# Patient Record
Sex: Female | Born: 1940 | Race: White | Hispanic: No | Marital: Married | Smoking: Never smoker
Health system: Southern US, Community
[De-identification: ages and names within clinical notes are randomized; demographics above are authoritative.]

## PROBLEM LIST (undated history)

## (undated) ENCOUNTER — Emergency Department

## (undated) DIAGNOSIS — C3490 Malignant neoplasm of unspecified part of unspecified bronchus or lung: Secondary | ICD-10-CM

## (undated) DIAGNOSIS — E785 Hyperlipidemia, unspecified: Secondary | ICD-10-CM

## (undated) DIAGNOSIS — E039 Hypothyroidism, unspecified: Secondary | ICD-10-CM

## (undated) DIAGNOSIS — R51 Headache: Secondary | ICD-10-CM

## (undated) DIAGNOSIS — I442 Atrioventricular block, complete: Secondary | ICD-10-CM

## (undated) DIAGNOSIS — Z950 Presence of cardiac pacemaker: Secondary | ICD-10-CM

## (undated) DIAGNOSIS — I729 Aneurysm of unspecified site: Secondary | ICD-10-CM

## (undated) DIAGNOSIS — R519 Headache, unspecified: Secondary | ICD-10-CM

## (undated) DIAGNOSIS — I10 Essential (primary) hypertension: Secondary | ICD-10-CM

## (undated) DIAGNOSIS — Z87448 Personal history of other diseases of urinary system: Secondary | ICD-10-CM

## (undated) DIAGNOSIS — R911 Solitary pulmonary nodule: Secondary | ICD-10-CM

## (undated) DIAGNOSIS — R011 Cardiac murmur, unspecified: Secondary | ICD-10-CM

## (undated) HISTORY — DX: Essential (primary) hypertension: I10

## (undated) HISTORY — DX: Atrioventricular block, complete: I44.2

## (undated) HISTORY — DX: Hyperlipidemia, unspecified: E78.5

## (undated) HISTORY — DX: Hypothyroidism, unspecified: E03.9

## (undated) HISTORY — PX: TUBAL LIGATION: SHX77

## (undated) HISTORY — PX: TONSILLECTOMY: SUR1361

---

## 2004-10-17 ENCOUNTER — Ambulatory Visit: Payer: Self-pay | Admitting: Internal Medicine

## 2005-11-13 ENCOUNTER — Ambulatory Visit: Payer: Self-pay | Admitting: Internal Medicine

## 2006-11-05 ENCOUNTER — Ambulatory Visit: Payer: Self-pay | Admitting: Internal Medicine

## 2007-11-10 ENCOUNTER — Ambulatory Visit: Payer: Self-pay | Admitting: Internal Medicine

## 2008-11-21 ENCOUNTER — Ambulatory Visit: Payer: Self-pay | Admitting: Internal Medicine

## 2010-02-18 ENCOUNTER — Ambulatory Visit: Payer: Self-pay | Admitting: Internal Medicine

## 2010-12-29 DIAGNOSIS — Z950 Presence of cardiac pacemaker: Secondary | ICD-10-CM

## 2010-12-29 HISTORY — DX: Presence of cardiac pacemaker: Z95.0

## 2011-02-27 ENCOUNTER — Ambulatory Visit: Payer: Self-pay | Admitting: Internal Medicine

## 2011-07-24 ENCOUNTER — Encounter: Payer: Self-pay | Admitting: Internal Medicine

## 2011-07-30 ENCOUNTER — Other Ambulatory Visit: Payer: Self-pay | Admitting: Cardiology

## 2011-07-30 ENCOUNTER — Ambulatory Visit
Admission: RE | Admit: 2011-07-30 | Discharge: 2011-07-30 | Disposition: A | Discharge status: Home / Self Care | Payer: BC Managed Care – PPO | Source: Ambulatory Visit | Attending: Cardiology | Admitting: Cardiology

## 2011-07-30 DIAGNOSIS — I442 Atrioventricular block, complete: Secondary | ICD-10-CM

## 2011-07-30 HISTORY — PX: PACEMAKER INSERTION: SHX728

## 2011-07-31 ENCOUNTER — Other Ambulatory Visit: Payer: Self-pay | Admitting: Cardiology

## 2011-07-31 DIAGNOSIS — I712 Thoracic aortic aneurysm, without rupture: Secondary | ICD-10-CM

## 2011-08-04 ENCOUNTER — Ambulatory Visit
Admission: RE | Admit: 2011-08-04 | Discharge: 2011-08-04 | Disposition: A | Discharge status: Home / Self Care | Payer: BC Managed Care – PPO | Source: Ambulatory Visit | Attending: Cardiology | Admitting: Cardiology

## 2011-08-04 ENCOUNTER — Inpatient Hospital Stay: Admission: RE | Admit: 2011-08-04 | Payer: Self-pay | Source: Ambulatory Visit

## 2011-08-04 ENCOUNTER — Other Ambulatory Visit: Payer: Self-pay | Admitting: Cardiology

## 2011-08-04 DIAGNOSIS — I712 Thoracic aortic aneurysm, without rupture: Secondary | ICD-10-CM

## 2011-08-06 ENCOUNTER — Encounter: Payer: Self-pay | Admitting: Internal Medicine

## 2011-08-07 ENCOUNTER — Ambulatory Visit (INDEPENDENT_AMBULATORY_CARE_PROVIDER_SITE_OTHER): Payer: BC Managed Care – PPO | Admitting: Internal Medicine

## 2011-08-07 ENCOUNTER — Encounter: Payer: Self-pay | Admitting: Internal Medicine

## 2011-08-07 DIAGNOSIS — I10 Essential (primary) hypertension: Secondary | ICD-10-CM

## 2011-08-07 DIAGNOSIS — I442 Atrioventricular block, complete: Secondary | ICD-10-CM | POA: Insufficient documentation

## 2011-08-07 NOTE — Assessment & Plan Note (Signed)
Her blood pressure is elevated today. She admits to being anxious. After her pacemaker is placed, I will defer up titration of her medical therapy to Dr. Donnie Aho. I have asked her to maintain a low-sodium diet.

## 2011-08-07 NOTE — Progress Notes (Signed)
HPI Katrina Hunter is referred today by Dr. Donnie Aho for evaluation of CHB. The patient is very active and looks younger than her stated age. She still works full time. She denies chest pain or shortness of breath. Her husband who is with her today states that with exertion, particularly when she is walking up an incline, she is more easily fatigued. She has had no syncope. She denies peripheral edema. Allergies  Allergen Reactions  . Crestor (Rosuvastatin Calcium)     myalgia  . Sulfonamide Derivatives      Current Outpatient Prescriptions  Medication Sig Dispense Refill  . amLODipine-benazepril (LOTREL) 10-20 MG per capsule Take 1 capsule by mouth daily.        . cetirizine (ZYRTEC) 10 MG tablet Take 10 mg by mouth daily.       . cholecalciferol (VITAMIN D) 1000 UNITS tablet Take 1,000 Units by mouth daily.        . Cyanocobalamin (VITAMIN B-12) 2500 MCG SUBL Place under the tongue 3 (three) times a week.       . triamterene-hydrochlorothiazide (MAXZIDE-25) 37.5-25 MG per tablet Take 1 tablet by mouth daily.           Past Medical History  Diagnosis Date  . AV block, complete   . HTN (hypertension)   . HLD (hyperlipidemia)   . Hypothyroidism     ROS:   All systems reviewed and negative except as noted in the HPI.   Past Surgical History  Procedure Date  . Tonsillectomy and adenoidectomy      Family History  Problem Relation Age of Onset  . Heart disease Father   . Breast cancer Mother      History   Social History  . Marital Status: Married    Spouse Name: N/A    Number of Children: N/A  . Years of Education: N/A   Occupational History  . banker    Social History Main Topics  . Smoking status: Never Smoker   . Smokeless tobacco: Not on file  . Alcohol Use: No  . Drug Use: Not on file  . Sexually Active: Not on file   Other Topics Concern  . Not on file   Social History Narrative  . No narrative on file     BP 184/78  Pulse 53  Ht 5\' 3"  (1.6 m)   Wt 148 lb (67.132 kg)  BMI 26.22 kg/m2  Physical Exam:  Well appearing looking younger than her stated age, NAD HEENT: Unremarkable Neck:  No JVD, no thyromegally Lymphatics:  No adenopathy Back:  No CVA tenderness Lungs:  Clear with no wheezes or rhonchi. HEART:  Regular rate rhythm, no murmurs, no rubs, no clicks Abd:  soft, positive bowel sounds, no organomegally, no rebound, no guarding Ext:  2 plus pulses, no edema, no cyanosis, no clubbing Skin:  No rashes no nodules Neuro:  CN II through XII intact, motor grossly intact  EKG Normal sinus rhythm with complete heart block   Assess/Plan:

## 2011-08-07 NOTE — Assessment & Plan Note (Signed)
I discussed the treatment options with the patient. The risks, benefits, goals, and expectations of permanent pacemaker insertion have been discussed with the patient and she wishes to proceed.

## 2011-08-08 ENCOUNTER — Encounter: Payer: Self-pay | Admitting: *Deleted

## 2011-08-08 ENCOUNTER — Telehealth: Payer: Self-pay | Admitting: *Deleted

## 2011-08-08 DIAGNOSIS — R001 Bradycardia, unspecified: Secondary | ICD-10-CM

## 2011-08-08 DIAGNOSIS — Z01810 Encounter for preprocedural cardiovascular examination: Secondary | ICD-10-CM

## 2011-08-08 NOTE — Telephone Encounter (Signed)
Called patient to set up pre work up labs for pacer insertion for 8/16. She will present to the [facility]. Lab on 8/14. Advised pacer implant with Dr.Taylor on 8/16 6 am for 730am,NPO after MN.

## 2011-08-11 ENCOUNTER — Encounter: Payer: Self-pay | Admitting: Internal Medicine

## 2011-08-12 ENCOUNTER — Other Ambulatory Visit (INDEPENDENT_AMBULATORY_CARE_PROVIDER_SITE_OTHER): Payer: BC Managed Care – PPO

## 2011-08-12 DIAGNOSIS — R001 Bradycardia, unspecified: Secondary | ICD-10-CM

## 2011-08-12 DIAGNOSIS — I498 Other specified cardiac arrhythmias: Secondary | ICD-10-CM

## 2011-08-12 DIAGNOSIS — Z01810 Encounter for preprocedural cardiovascular examination: Secondary | ICD-10-CM

## 2011-08-12 LAB — BASIC METABOLIC PANEL
BUN: 17 mg/dL (ref 6–23)
CO2: 28 mEq/L (ref 19–32)
Chloride: 105 mEq/L (ref 96–112)
Glucose, Bld: 89 mg/dL (ref 70–99)
Potassium: 3.4 mEq/L — ABNORMAL LOW (ref 3.5–5.1)
Sodium: 141 mEq/L (ref 135–145)

## 2011-08-12 LAB — CBC WITH DIFFERENTIAL/PLATELET
Basophils Relative: 1.1 % (ref 0.0–3.0)
Eosinophils Relative: 5.1 % — ABNORMAL HIGH (ref 0.0–5.0)
Monocytes Relative: 7.7 % (ref 3.0–12.0)
Neutrophils Relative %: 59.4 % (ref 43.0–77.0)
Platelets: 237 10*3/uL (ref 150.0–400.0)
RBC: 4.32 Mil/uL (ref 3.87–5.11)
WBC: 5.7 10*3/uL (ref 4.5–10.5)

## 2011-08-12 LAB — APTT: aPTT: 28.3 s (ref 21.7–28.8)

## 2011-08-14 ENCOUNTER — Ambulatory Visit (HOSPITAL_COMMUNITY)
Admission: RE | Admit: 2011-08-14 | Discharge: 2011-08-15 | Disposition: A | Discharge status: Home / Self Care | Payer: BC Managed Care – PPO | Source: Ambulatory Visit | Attending: Internal Medicine | Admitting: Internal Medicine

## 2011-08-14 DIAGNOSIS — I442 Atrioventricular block, complete: Secondary | ICD-10-CM

## 2011-08-14 DIAGNOSIS — M6289 Other specified disorders of muscle: Secondary | ICD-10-CM | POA: Insufficient documentation

## 2011-08-14 DIAGNOSIS — E039 Hypothyroidism, unspecified: Secondary | ICD-10-CM | POA: Insufficient documentation

## 2011-08-14 DIAGNOSIS — I10 Essential (primary) hypertension: Secondary | ICD-10-CM | POA: Insufficient documentation

## 2011-08-14 DIAGNOSIS — Z23 Encounter for immunization: Secondary | ICD-10-CM | POA: Insufficient documentation

## 2011-08-14 DIAGNOSIS — R911 Solitary pulmonary nodule: Secondary | ICD-10-CM | POA: Insufficient documentation

## 2011-08-14 DIAGNOSIS — E785 Hyperlipidemia, unspecified: Secondary | ICD-10-CM | POA: Insufficient documentation

## 2011-08-14 LAB — SURGICAL PCR SCREEN
MRSA, PCR: NEGATIVE
Staphylococcus aureus: NEGATIVE

## 2011-08-15 ENCOUNTER — Ambulatory Visit (HOSPITAL_COMMUNITY): Payer: BC Managed Care – PPO

## 2011-08-19 ENCOUNTER — Ambulatory Visit (INDEPENDENT_AMBULATORY_CARE_PROVIDER_SITE_OTHER): Payer: BC Managed Care – PPO | Admitting: Critical Care Medicine

## 2011-08-19 ENCOUNTER — Encounter: Payer: Self-pay | Admitting: Critical Care Medicine

## 2011-08-19 DIAGNOSIS — I442 Atrioventricular block, complete: Secondary | ICD-10-CM | POA: Insufficient documentation

## 2011-08-19 DIAGNOSIS — I10 Essential (primary) hypertension: Secondary | ICD-10-CM | POA: Insufficient documentation

## 2011-08-19 DIAGNOSIS — R911 Solitary pulmonary nodule: Secondary | ICD-10-CM | POA: Insufficient documentation

## 2011-08-19 DIAGNOSIS — E039 Hypothyroidism, unspecified: Secondary | ICD-10-CM | POA: Insufficient documentation

## 2011-08-19 DIAGNOSIS — E785 Hyperlipidemia, unspecified: Secondary | ICD-10-CM | POA: Insufficient documentation

## 2011-08-19 DIAGNOSIS — J984 Other disorders of lung: Secondary | ICD-10-CM

## 2011-08-19 NOTE — Patient Instructions (Signed)
A Super Dimension CT chest will be ordered A PET Scan will be ordered A bronchoscopy with electromagnetic navigation will be ordered per Dr Delton Coombes at [facility]

## 2011-08-19 NOTE — Progress Notes (Signed)
Subjective:    Patient ID: Katrina Hunter, female    DOB: [birth year], 70 y.o.   MRN: [id number]  HPI  70 y.o.WF Pt has a nodule on lung. Found on routine preop CXR for PPM.  Had bronchitis one year and had CXR many years ago, not recently. No real pulm symptoms. Pt denies any significant sore throat, nasal congestion or excess secretions, fever, chills, sweats, unintended weight loss, pleurtic or exertional chest pain, orthopnea PND, or leg swelling Pt denies any increase in rescue therapy over baseline, denies waking up needing it or having any early am or nocturnal exacerbations of coughing/wheezing/or dyspnea. Pt also denies any obvious fluctuation in symptoms with  weather or environmental change or other alleviating or aggravating factors  Had allergies and allergic rhinitis but zyrtec helps this. 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 No TB exposure  Past Medical History  Diagnosis Date  . AV block, complete     PPM placed 07/2011 [facility]  . HTN (hypertension)   . HLD (hyperlipidemia)   . Hypothyroidism      Family History  Problem Relation Age of Onset  . Heart disease Father   . Breast cancer Mother      History   Social History  . Marital Status: Married    Spouse Name: N/A    Number of Children: 3  . Years of Education: N/A   Occupational History  . banker   .     Social History Main Topics  . Smoking status: Never Smoker   . Smokeless tobacco: Never Used  . Alcohol Use: No  . Drug Use: Not on file  . Sexually Active: Not on file   Other Topics Concern  . Not on file   Social History Narrative  . No narrative on file     Allergies  Allergen Reactions  . Crestor (Rosuvastatin Calcium)     myalgia  . Sulfonamide Derivatives      Outpatient Prescriptions Prior to Visit  Medication Sig Dispense Refill  . amLODipine-benazepril (LOTREL) 10-20 MG per capsule Take 1 capsule by mouth daily.         . cetirizine (ZYRTEC) 10 MG tablet Take 10 mg by mouth daily.       . cholecalciferol (VITAMIN D) 1000 UNITS tablet Take 1,000 Units by mouth daily.        Marland Kitchen triamterene-hydrochlorothiazide (MAXZIDE-25) 37.5-25 MG per tablet Take 1 tablet by mouth daily.        . Cyanocobalamin (VITAMIN B-12) 2500 MCG SUBL Place under the tongue 3 (three) times a week.          Review of Systems  Constitutional: Negative for fever, chills, diaphoresis, activity change, appetite change, fatigue and unexpected weight change.  HENT: Negative for hearing loss, ear pain, nosebleeds, congestion, sore throat, facial swelling, rhinorrhea, sneezing, mouth sores, trouble swallowing, neck pain, neck stiffness, dental problem, voice change, postnasal drip, sinus pressure, tinnitus and ear discharge.   Eyes: Negative for photophobia, discharge, itching and visual disturbance.  Respiratory: Negative for apnea, cough, choking, chest tightness, shortness of breath, wheezing and stridor.   Cardiovascular: Negative for chest pain, palpitations and leg swelling.  Gastrointestinal: Negative for nausea, vomiting, abdominal pain, constipation, blood in stool and abdominal distention.  Genitourinary: Negative for dysuria, urgency, frequency, hematuria, flank pain, decreased urine volume and difficulty urinating.  Musculoskeletal: Negative for myalgias, back pain, joint swelling, arthralgias and gait problem.  Skin: Negative  for color change, pallor and rash.  Neurological: Negative for dizziness, tremors, seizures, syncope, speech difficulty, weakness, light-headedness, numbness and headaches.  Hematological: Negative for adenopathy. Does not bruise/bleed easily.  Psychiatric/Behavioral: Negative for confusion, sleep disturbance and agitation. The patient is not nervous/anxious.        Objective:   Physical Exam Filed Vitals:   08/19/11 1118  BP: 148/90  Pulse: 66  Temp: 98 F (36.7 C)  TempSrc: Oral  Height: 5' 3.5"  (1.613 m)  Weight: 150 lb 3.2 oz (68.13 kg)  SpO2: 97%    Gen: Pleasant, well-nourished, in no distress,  normal affect  ENT: No lesions,  mouth clear,  oropharynx clear, no postnasal drip  Neck: No JVD, no TMG, no carotid bruits  Lungs: No use of accessory muscles, no dullness to percussion, clear without rales or rhonchi  Cardiovascular: RRR, heart sounds normal, no murmur or gallops, no peripheral edema  Abdomen: soft and NT, no HSM,  BS normal  Musculoskeletal: No deformities, no cyanosis or clubbing  Neuro: alert, non focal  Skin: Warm, no lesions or rashes   08/04/11 CT Chest Findings: Ascending aortic aneurysm noted. Aortic root measures  4.2 cm transverse. Mid ascending aorta measures 4.4 cm. Anterior  arch measures 3.8 cm. Descending arch measures 3.2 cm. Descending  mid thoracic aorta measures 2.2 cm. At the hiatus the aorta  measures 2.3 cm. There is mild atherosclerotic calcification of  the aortic arch and descending thoracic aorta. No definite  pathologic thoracic adenopathy identified.  Slight nodularity in the thyroid gland noted on the left side, with  a 6 mm nodule suspected on image 5 of series 3.  No pericardial effusion identified.  No cardiac valvular calcifications noted. A slightly spiculated  1.2 x 1.0 cm nodule is present in the left lower lobe on image 31  of series 4. A pulmonary arterial branch extends directly towards  this lesion.  No mass of the visualized adrenal glands identified.  IMPRESSION:  1. 1.2 x 1.0 cm pulmonary nodule in the left lower lobe is  suspicious for bronchogenic carcinoma. Focal inflammatory process  or possibly vascular malformation would have a similar appearance.  The lesion is amenable to percutaneous biopsy and might be  amendable to endobronchial ultrasound. Alternatively, nuclear  medicine PET CT could be utilized to assess the metabolic activity  of this nodule.  2. Ascending aortic aneurysm.  3. Mild  atherosclerotic calcification of the aortic arch and  descending thoracic aorta.      Assessment & Plan:   Pulmonary nodule, left 1.2 x .9 cm nodule LLL non calcified  Life long never smoker  Airway goes through nodule, too central for TTNA Suspicious for CA Needs Bx  Plan Super D CT scan of chest and PET scan ENB later after above studies completed    Updated Medication List Outpatient Encounter Prescriptions as of 08/19/2011  Medication Sig Dispense Refill  . amLODipine-benazepril (LOTREL) 10-20 MG per capsule Take 1 capsule by mouth daily.        . cetirizine (ZYRTEC) 10 MG tablet Take 10 mg by mouth daily.       . cholecalciferol (VITAMIN D) 1000 UNITS tablet Take 1,000 Units by mouth daily.        Marland Kitchen triamterene-hydrochlorothiazide (MAXZIDE-25) 37.5-25 MG per tablet Take 1 tablet by mouth daily.        Marland Kitchen DISCONTD: Cyanocobalamin (VITAMIN B-12) 2500 MCG SUBL Place under the tongue 3 (three) times a week.

## 2011-08-20 NOTE — Assessment & Plan Note (Signed)
1.2 x .9 cm nodule LLL non calcified  Life long never smoker  Airway goes through nodule, too central for TTNA Suspicious for CA Needs Bx  Plan Super D CT scan of chest and PET scan ENB later after above studies completed

## 2011-08-25 ENCOUNTER — Ambulatory Visit (INDEPENDENT_AMBULATORY_CARE_PROVIDER_SITE_OTHER): Payer: BC Managed Care – PPO | Admitting: *Deleted

## 2011-08-25 DIAGNOSIS — I442 Atrioventricular block, complete: Secondary | ICD-10-CM

## 2011-08-25 LAB — PACEMAKER DEVICE OBSERVATION
AL AMPLITUDE: 5.6 mv
AL IMPEDENCE PM: 664 Ohm
RV LEAD IMPEDENCE PM: 577 Ohm
RV LEAD THRESHOLD: 0.5 V
VENTRICULAR PACING PM: 99

## 2011-08-25 NOTE — Progress Notes (Signed)
Wound check-PPM 

## 2011-09-04 ENCOUNTER — Encounter (HOSPITAL_COMMUNITY)
Admission: RE | Admit: 2011-09-04 | Discharge: 2011-09-04 | Disposition: A | Discharge status: Home / Self Care | Payer: BC Managed Care – PPO | Source: Ambulatory Visit | Attending: Critical Care Medicine | Admitting: Critical Care Medicine

## 2011-09-04 ENCOUNTER — Encounter (HOSPITAL_COMMUNITY): Payer: Self-pay

## 2011-09-04 DIAGNOSIS — R911 Solitary pulmonary nodule: Secondary | ICD-10-CM

## 2011-09-04 DIAGNOSIS — J984 Other disorders of lung: Secondary | ICD-10-CM | POA: Insufficient documentation

## 2011-09-04 DIAGNOSIS — Z950 Presence of cardiac pacemaker: Secondary | ICD-10-CM | POA: Insufficient documentation

## 2011-09-04 DIAGNOSIS — I712 Thoracic aortic aneurysm, without rupture, unspecified: Secondary | ICD-10-CM | POA: Insufficient documentation

## 2011-09-04 MED ORDER — FLUDEOXYGLUCOSE F - 18 (FDG) INJECTION
18.1000 | Freq: Once | INTRAVENOUS | Status: AC | PRN
  Administered 2011-09-04: 18.1 via INTRAVENOUS

## 2011-09-19 ENCOUNTER — Ambulatory Visit: Payer: Self-pay | Admitting: Critical Care Medicine

## 2011-10-06 ENCOUNTER — Telehealth: Payer: Self-pay | Admitting: Critical Care Medicine

## 2011-10-06 NOTE — Telephone Encounter (Signed)
Set appt 12/10/11 @ 3 pm Katrina Hunter

## 2011-10-06 NOTE — Telephone Encounter (Signed)
Called pt's home and cell numbers - [email or web address].  Need to schedule pt for OV with PW in December.

## 2011-10-07 NOTE — Progress Notes (Signed)
Quick Note:  Please see phone note from 10/06/2011 -- pt has scheduled a follow up with Dr. Delford Field for 12/10/11. ______

## 2011-10-09 NOTE — Op Note (Signed)
  Katrina Hunter, Katrina Hunter               ACCOUNT NO.:  [id number]  MEDICAL RECORD NO.:  [id number]  LOCATION:  3714                         FACILITY:  [facility]  PHYSICIAN:  Doylene Canning. Ladona Ridgel, MD    DATE OF BIRTH:  05-31-1941  DATE OF PROCEDURE:  08/14/2011 DATE OF DISCHARGE:                              OPERATIVE REPORT   PROCEDURE PERFORMED:  Insertion of dual-chamber pacemaker.  INDICATIONS:  Symptomatic complete heart block.  INTRODUCTION:  The patient is a 70 year old woman with a history complete heart block and a slow ventricular escape.  She is now referred for permanent pacemaker insertion.  PROCEDURE:  After informed consent was obtained, the patient was taken to the Diagnostic EP Lab in the fasting state.  After usual preparation and draping, intravenous fentanyl and midazolam was given for sedation. 30 mL of lidocaine was infiltrated into the left infraclavicular region. A 5-cm incision was carried out over this region.  Electrocautery was utilized to dissect down to the fascial plane.  The left subclavian vein was punctured x2 and the Medtronic model 5076, 52-cm active fixation pacing lead, serial number [id number] was advanced into the right ventricle.  The Medtronic model [id number], 45-cm active fixation pacing lead, serial [id number] was advanced into the right atrium.  Mapping was carried out in the right ventricle and at the final site on the RV septum the R-waves measures between 6-7 mV.  The pacing impedance was 700 ohms and a threshold was a volt at 0.5 milliseconds.  10-volt pacing did not stimulate the diaphragm.  With the ventricular lead in satisfactory position, attention was then turned to placement of the atrial lead was and paced in the anterolateral portion of the right atrium where P-waves measured 3 mV, the pacing impedance was 700 ohms, and threshold was 0.6 volt at 0.5 milliseconds.  10-volt pacing again did not stimulate the diaphragm.  With atrial and  ventricular leads in satisfactory position, they were secured to the subpectoralis fascia with a figure-of-eight silk suture.  The sewing sleeve was secured with silk suture.  Electrocautery was utilized to make a subcutaneous pocket. Antibiotic irrigation was utilized to irrigate the pocket and electrocautery was utilized to assure hemostasis.  The Medtronic Sensia dual-chamber pacemaker, serial number [id number] was connected to the atrial and ventricular leads and placed back in the subcutaneous pocket where it was secured with silk suture.  The pocket was irrigated with antibiotic irrigation and the incision was closed with 2-0 and 3-0 Vicryl.  Benzoin and Steri-Strips were painted on the skin, pressure dressing was applied, and the patient was returned to her room in satisfactory condition.  COMPLICATIONS:  There were no immediate procedure complications.  RESULTS:  This demonstrates successful implantation of a Medtronic dual- chamber pacemaker in a patient with symptomatic complete heart block.     Doylene Canning. Ladona Ridgel, MD     GWT/MEDQ  D:  08/14/2011  T:  08/14/2011  Job:  [id number]  cc:   Georga Hacking, M.D.  Electronically Signed by Lewayne Bunting MD on 10/09/2011 06:39:09 PM

## 2011-10-09 NOTE — Discharge Summary (Signed)
Katrina Hunter, Katrina Hunter               ACCOUNT NO.:  [id number]  MEDICAL RECORD NO.:  [id number]  LOCATION:  3714                         FACILITY:  [facility]  PHYSICIAN:  Doylene Canning. Ladona Ridgel, MD    DATE OF BIRTH:  15-Sep-1941  DATE OF ADMISSION:  08/14/2011 DATE OF DISCHARGE:                              DISCHARGE SUMMARY   PRIMARY CARE PHYSICIAN:  Bethann Punches, MD, at the [facility]  PRIMARY CARDIOLOGIST:  Georga Hacking, MD  ELECTROPHYSIOLOGIST:  Doylene Canning. Ladona Ridgel, MD  PRIMARY DIAGNOSIS:  Symptomatic complete heart block.  SECONDARY DIAGNOSES: 1. Hypertension. 2. Hyperlipidemia. 3. Hypothyroidism. 4. Left lower lobe lung nodule, currently under workup with Dr.     Shan Levans.  ALLERGIES:  The patient is allergic to SULFA and STATIN.  PROCEDURE THIS ADMISSION: 1. Implantation of dual-chamber pacemaker on August 14, 2011, by Dr.     Ladona Ridgel.  The patient received a Medtronic Sensia model number     [id number] pacemaker,  model number 5076 right atrial and right ventricular lead.     The patient had no early apparent complications. 2. Chest x-ray on August 15, 2011.  Official read is pending at the     time of dictation.  BRIEF HISTORY OF PRESENT ILLNESS:  [name] is a 70 year old female who is still working full time and is very active.  She was found to have complete heart block after being evaluated by Dr. Donnie Aho for fatigue and dizziness.  She was referred to Dr. Ladona Ridgel in the outpatient setting for treatment options.  Risks, benefits, and alternatives of placement implantation were discussed with the patient and she wished to proceed.  HOSPITAL COURSE:  The patient was admitted on August 14, 2011, for planned implantation of dual-chamber pacemaker.  This was carried by Dr. Ladona Ridgel with details outlined above.  She was monitored on telemetry overnight which demonstrated AV pacing.  Her left chest was without hematoma or ecchymosis.  Device interrogation was carried out  and was found to be functioning normally.  Chest x-ray was obtained and will be reviewed by Dr. Ladona Ridgel prior to discharge.  The patient was examined by Dr. Ladona Ridgel on August 15, 2011, and considered stable for discharge.  FOLLOWUP APPOINTMENTS: 1. [facility] on August 25, 2011, at 3:30 p.m. 2. Dr. Donnie Aho as scheduled. 3. Dr. Bethann Punches as scheduled.  DISCHARGE INSTRUCTIONS: 1. Increase activity slowly. 2. No driving for 1 week. 3. Follow a low-sodium, heart-healthy diet. 4. See supplemental device discharge instructions for wound care and     arm mobility. 5. Keep incision clean and dry for 1 week.  DISCHARGE MEDICATIONS: 1. Lotrel 10/20 mg daily. 2. Maxzide 37.5/25 mg daily. 3. Vitamin D3 daily. 4. Zyrtec 10 mg daily.  DISPOSITION:  The patient is seen and examined by Dr. Ladona Ridgel on August 15, 2011, and considered stable for discharge.  DURATION OF DISCHARGE ENCOUNTER:  35 minutes.     Gypsy Balsam, RN,BSN   ______________________________ Doylene Canning. Ladona Ridgel, MD    AS/MEDQ  D:  08/15/2011  T:  08/15/2011  Job:  [id number]  cc:   Georga Hacking, M.D. Bethann Punches, MD  Electronically Signed by [name]  [name] on 08/21/2011 08:54:53 AM Electronically Signed by Lewayne Bunting MD on 10/09/2011 06:39:14 PM

## 2011-12-10 ENCOUNTER — Encounter: Payer: Self-pay | Admitting: Critical Care Medicine

## 2011-12-10 ENCOUNTER — Ambulatory Visit (INDEPENDENT_AMBULATORY_CARE_PROVIDER_SITE_OTHER): Payer: BC Managed Care – PPO | Admitting: Critical Care Medicine

## 2011-12-10 VITALS — BP 110/72 | HR 78 | Temp 97.7°F | Ht 63.5 in | Wt 149.6 lb

## 2011-12-10 DIAGNOSIS — J984 Other disorders of lung: Secondary | ICD-10-CM

## 2011-12-10 DIAGNOSIS — R911 Solitary pulmonary nodule: Secondary | ICD-10-CM

## 2011-12-10 NOTE — Progress Notes (Signed)
Subjective:    Patient ID: Katrina Hunter, female    DOB: [birth year], 70 y.o.   MRN: [id number]  HPI   70 y.o.WF Pt has a nodule on lung. Found on routine preop CXR for PPM.  Had bronchitis one year and had CXR many years ago, not recently. No real pulm symptoms. Pt denies any significant sore throat, nasal congestion or excess secretions, fever, chills, sweats, unintended weight loss, pleurtic or exertional chest pain, orthopnea PND, or leg swelling Pt denies any increase in rescue therapy over baseline, denies waking up needing it or having any early am or nocturnal exacerbations of coughing/wheezing/or dyspnea. Pt also denies any obvious fluctuation in symptoms with  weather or environmental change or other alleviating or aggravating factors  Had allergies and allergic rhinitis but zyrtec helps this. 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 No TB exposure  12/10/2011 Endurance is better after PPM .  BP is now lower.  Less dyspnea.  F/u LLL nodule with neg PET scan No mucus.  No chest pain.  No f/c/s.  No pulm symptoms .   Past Medical History  Diagnosis Date  . 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 HTN (hypertension)   . HLD (hyperlipidemia)   . Hypothyroidism      Family History  Problem Relation Age of Onset  . Heart disease Father   . Breast cancer Mother      History   Social History  . Marital Status: Married    Spouse Name: N/A    Number of Children: 3  . Years of Education: N/A   Occupational History  . banker   .     Social History Main Topics  . Smoking status: Never Smoker   . Smokeless tobacco: Never Used  . Alcohol Use: No  . Drug Use: Not on file  . Sexually Active: Not on file   Other Topics Concern  . Not on file   Social History Narrative  . No narrative on file     Allergies  Allergen Reactions  . Crestor (Rosuvastatin Calcium)     myalgia  . Sulfonamide  Derivatives      Outpatient Prescriptions Prior to Visit  Medication Sig Dispense Refill  . amLODipine-benazepril (LOTREL) 10-20 MG per capsule Take 1 capsule by mouth daily.        . cetirizine (ZYRTEC) 10 MG tablet Take 10 mg by mouth daily.       . cholecalciferol (VITAMIN D) 1000 UNITS tablet Take 1,000 Units by mouth daily.        Marland Kitchen triamterene-hydrochlorothiazide (MAXZIDE-25) 37.5-25 MG per tablet Take 1 tablet by mouth daily.           Review of Systems  Constitutional: Negative for fever, chills, diaphoresis, activity change, appetite change, fatigue and unexpected weight change.  HENT: Negative for hearing loss, ear pain, nosebleeds, congestion, sore throat, facial swelling, rhinorrhea, sneezing, mouth sores, trouble swallowing, neck pain, neck stiffness, dental problem, voice change, postnasal drip, sinus pressure, tinnitus and ear discharge.   Eyes: Negative for photophobia, discharge, itching and visual disturbance.  Respiratory: Negative for apnea, cough, choking, chest tightness, shortness of breath, wheezing and stridor.   Cardiovascular: Negative for chest pain, palpitations and leg swelling.  Gastrointestinal: Negative for nausea, vomiting, abdominal pain, constipation, blood in stool and abdominal distention.  Genitourinary: Negative for dysuria, urgency, frequency, hematuria, flank pain, decreased urine volume and  difficulty urinating.  Musculoskeletal: Negative for myalgias, back pain, joint swelling, arthralgias and gait problem.  Skin: Negative for color change, pallor and rash.  Neurological: Negative for dizziness, tremors, seizures, syncope, speech difficulty, weakness, light-headedness, numbness and headaches.  Hematological: Negative for adenopathy. Does not bruise/bleed easily.  Psychiatric/Behavioral: Negative for confusion, sleep disturbance and agitation. The patient is not nervous/anxious.        Objective:   Physical Exam  Filed Vitals:   12/10/11 1517   BP: 110/72  Pulse: 78  Temp: 97.7 F (36.5 C)  TempSrc: Oral  Height: 5' 3.5" (1.613 m)  Weight: 149 lb 9.6 oz (67.858 kg)  SpO2: 95%    Gen: Pleasant, well-nourished, in no distress,  normal affect  ENT: No lesions,  mouth clear,  oropharynx clear, no postnasal drip  Neck: No JVD, no TMG, no carotid bruits  Lungs: No use of accessory muscles, no dullness to percussion, clear without rales or rhonchi  Cardiovascular: RRR, heart sounds normal, no murmur or gallops, no peripheral edema  Abdomen: soft and NT, no HSM,  BS normal  Musculoskeletal: No deformities, no cyanosis or clubbing  Neuro: alert, non focal  Skin: Warm, no lesions or rashes   08/04/11 CT Chest Findings: Ascending aortic aneurysm noted. Aortic root measures  4.2 cm transverse. Mid ascending aorta measures 4.4 cm. Anterior  arch measures 3.8 cm. Descending arch measures 3.2 cm. Descending  mid thoracic aorta measures 2.2 cm. At the hiatus the aorta  measures 2.3 cm. There is mild atherosclerotic calcification of  the aortic arch and descending thoracic aorta. No definite  pathologic thoracic adenopathy identified.  Slight nodularity in the thyroid gland noted on the left side, with  a 6 mm nodule suspected on image 5 of series 3.  No pericardial effusion identified.  No cardiac valvular calcifications noted. A slightly spiculated  1.2 x 1.0 cm nodule is present in the left lower lobe on image 31  of series 4. A pulmonary arterial branch extends directly towards  this lesion.  No mass of the visualized adrenal glands identified.  IMPRESSION:  1. 1.2 x 1.0 cm pulmonary nodule in the left lower lobe is  suspicious for bronchogenic carcinoma. Focal inflammatory process  or possibly vascular malformation would have a similar appearance.  The lesion is amenable to percutaneous biopsy and might be  amendable to endobronchial ultrasound. Alternatively, nuclear  medicine PET CT could be utilized to assess the  metabolic activity  of this nodule.  2. Ascending aortic aneurysm.  3. Mild atherosclerotic calcification of the aortic arch and  descending thoracic aorta.    08/19/11 PET scan :  Neg for significant uptake in LLL nodule    Assessment & Plan:   Pulmonary nodule, left 1.2 x .9 cm nodule LLL non calcified  Life long never smoker  PET scan neg Likely benign Plan  no additional CT scans necessary Return pulmonary prn      Updated Medication List Outpatient Encounter Prescriptions as of 12/10/2011  Medication Sig Dispense Refill  . amLODipine-benazepril (LOTREL) 10-20 MG per capsule Take 1 capsule by mouth daily.        . cetirizine (ZYRTEC) 10 MG tablet Take 10 mg by mouth daily.       . cholecalciferol (VITAMIN D) 1000 UNITS tablet Take 1,000 Units by mouth daily.        . pravastatin (PRAVACHOL) 20 MG tablet Take 20 mg by mouth daily.        Marland Kitchen triamterene-hydrochlorothiazide (MAXZIDE-25)  37.5-25 MG per tablet Take 1 tablet by mouth daily.

## 2011-12-10 NOTE — Patient Instructions (Signed)
No additional scans needed Return as needed

## 2011-12-10 NOTE — Assessment & Plan Note (Signed)
1.2 x .9 cm nodule LLL non calcified  Life long never smoker  PET scan neg Likely benign Plan  no additional CT scans necessary Return pulmonary prn

## 2012-03-24 ENCOUNTER — Ambulatory Visit: Payer: Self-pay | Admitting: Internal Medicine

## 2012-03-31 ENCOUNTER — Encounter: Payer: Self-pay | Admitting: Internal Medicine

## 2012-08-24 ENCOUNTER — Other Ambulatory Visit: Payer: Self-pay | Admitting: Cardiology

## 2012-08-24 DIAGNOSIS — I712 Thoracic aortic aneurysm, without rupture: Secondary | ICD-10-CM

## 2012-08-31 ENCOUNTER — Other Ambulatory Visit: Payer: BC Managed Care – PPO

## 2012-09-01 ENCOUNTER — Ambulatory Visit
Admission: RE | Admit: 2012-09-01 | Discharge: 2012-09-01 | Disposition: A | Discharge status: Home / Self Care | Payer: BC Managed Care – PPO | Source: Ambulatory Visit | Attending: Cardiology | Admitting: Cardiology

## 2012-09-01 DIAGNOSIS — I712 Thoracic aortic aneurysm, without rupture: Secondary | ICD-10-CM

## 2013-04-20 ENCOUNTER — Ambulatory Visit: Payer: Self-pay | Admitting: Internal Medicine

## 2013-09-03 ENCOUNTER — Emergency Department: Payer: Self-pay | Admitting: Emergency Medicine

## 2013-09-04 LAB — SYNOVIAL CELL COUNT + DIFF, W/ CRYSTALS
Basophil: 0 %
Eosinophil: 0 %
Neutrophils: 72 %
Nucleated Cell Count: 1066 /mm3
Other Cells BF: 0 %
Other Mononuclear Cells: 26 %

## 2013-09-10 LAB — BODY FLUID CULTURE

## 2013-09-27 ENCOUNTER — Ambulatory Visit: Payer: Self-pay | Admitting: Orthopedic Surgery

## 2013-10-18 ENCOUNTER — Other Ambulatory Visit: Payer: Self-pay | Admitting: Cardiology

## 2013-10-18 DIAGNOSIS — I712 Thoracic aortic aneurysm, without rupture: Secondary | ICD-10-CM

## 2013-10-21 ENCOUNTER — Ambulatory Visit
Admission: RE | Admit: 2013-10-21 | Discharge: 2013-10-21 | Disposition: A | Discharge status: Home / Self Care | Payer: Medicare HMO | Source: Ambulatory Visit | Attending: Cardiology | Admitting: Cardiology

## 2013-10-21 DIAGNOSIS — I712 Thoracic aortic aneurysm, without rupture: Secondary | ICD-10-CM

## 2013-10-26 ENCOUNTER — Telehealth: Payer: Self-pay | Admitting: *Deleted

## 2013-10-26 NOTE — Telephone Encounter (Signed)
PW's first available at [facility] is Nov 18.  He is in office next week but is booked at this time.   Pt cannot go to [facility] d/t living in [place]. Spoke with PW:  He would like pt to see TP to dicuss CT Chest results and discuss plan.   I spoke with pt.  Explained above to her.  She verbalized understanding of this and is in agreement with this plan.  I offered OV with TP this week.  Pt unable to come this week but is available next week.  We have scheduled her to see TP on Monday, Nov 3 at 4:15 pm at [facility].  Pt aware and voiced no further questions or concerns at this time.

## 2013-10-26 NOTE — Telephone Encounter (Signed)
Message copied by Valentino Hue on Wed Oct 26, 2013  9:09 AM ------      Message from: Storm Frisk      Created: Tue Oct 25, 2013  4:59 PM      Regarding: RE: Need you to see patient again       We will get her in [place] need to get this pt in soon            pw      ----- Message -----         From: Othella Boyer, MD         Sent: 10/25/2013   4:23 PM           To: Storm Frisk, MD      Subject: Need you to see patient again                            Dennie Bible,            This is a lady you saw in 2012.  See the recent CT scan done mainly to follow her aortic aneurysm.  The radiologist said biopsy or removal.  Can you help out.            Thanks,            Karleen Hampshire        ------

## 2013-10-28 ENCOUNTER — Telehealth: Payer: Self-pay | Admitting: Critical Care Medicine

## 2013-10-28 NOTE — Telephone Encounter (Signed)
Pt aware that msg she received was old message from this week when Crystal was trying to contact her in regards to scheduling f/u appt to discuss CT.  Pt verified appt 10/31/13 @ 415 with TP Nothing further needed.

## 2013-10-31 ENCOUNTER — Ambulatory Visit: Payer: Medicare HMO | Admitting: Adult Health

## 2013-11-15 ENCOUNTER — Ambulatory Visit: Payer: Medicare HMO | Admitting: Critical Care Medicine

## 2013-11-22 ENCOUNTER — Encounter: Payer: Self-pay | Admitting: Critical Care Medicine

## 2013-11-22 ENCOUNTER — Ambulatory Visit (INDEPENDENT_AMBULATORY_CARE_PROVIDER_SITE_OTHER): Payer: Medicare HMO | Admitting: Critical Care Medicine

## 2013-11-22 ENCOUNTER — Encounter (INDEPENDENT_AMBULATORY_CARE_PROVIDER_SITE_OTHER): Payer: Self-pay

## 2013-11-22 VITALS — BP 114/70 | HR 79 | Temp 97.9°F | Ht 64.0 in | Wt 149.2 lb

## 2013-11-22 DIAGNOSIS — R911 Solitary pulmonary nodule: Secondary | ICD-10-CM

## 2013-11-22 NOTE — Patient Instructions (Signed)
I will confer with my partner Katrina Hunter about possibly doing a bronchoscopy on this lesion I will phone you tomorrow

## 2013-11-22 NOTE — Progress Notes (Signed)
Subjective:    Patient ID: Katrina Hunter, female    DOB: 06-May-1941, 72 y.o.   MRN: [id number]  HPI   72 y.o.WF Pt has a nodule on lung. Found on routine preop CXR for PPM.  Had bronchitis one year and had CXR many years ago, not recently. No real pulm symptoms. Pt denies any significant sore throat, nasal congestion or excess secretions, fever, chills, sweats, unintended weight loss, pleurtic or exertional chest pain, orthopnea PND, or leg swelling Pt denies any increase in rescue therapy over baseline, denies waking up needing it or having any early am or nocturnal exacerbations of coughing/wheezing/or dyspnea. Pt also denies any obvious fluctuation in symptoms with  weather or environmental change or other alleviating or aggravating factors  Had allergies and allergic rhinitis but zyrtec helps this. 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  BP is now lower.  Less dyspnea.  F/u LLL nodule with neg PET scan No mucus.  No chest pain.  No f/c/s.  No pulm symptoms .   11/22/2013 Chief Complaint  Patient presents with  . Follow-up    to discuss CT Chest results.   Has runny nose with clear drainange and sneezing. No SOB, wheezing, chest tightness/pain, or cough.    No increased cough, No chest pain. No allergies. Notes a runny nose. Cannot take a decongestant.  No ABX No hemoptysis. Life long never smoker.   The patient originally had a CT scan of the chest in 2012 showing a small left lower lobe nodule unfortunately this lesion has gotten larger over the ensuing 2 years and is now 1.4 x 1.4 cm. No PET scan in 2012 did not show any uptake. This still does not rule out a low-grade malignancy.  Past Medical History  Diagnosis Date  . 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 HTN (hypertension)   . HLD (hyperlipidemia)   . Hypothyroidism      Family History   Problem Relation Age of Onset  . Heart disease Father   . Breast cancer Mother      History   Social History  . Marital Status: Married    Spouse Name: N/A    Number of Children: 3  . Years of Education: N/A   Occupational History  . banker   .     Social History Main Topics  . Smoking status: Never Smoker   . Smokeless tobacco: Never Used  . Alcohol Use: No  . Drug Use: Not on file  . Sexual Activity: Not on file   Other Topics Concern  . Not on file   Social History Narrative  . No narrative on file     Allergies  Allergen Reactions  . Crestor [Rosuvastatin Calcium]     myalgia  . Sulfonamide Derivatives      Outpatient Prescriptions Prior to Visit  Medication Sig Dispense Refill  . amLODipine-benazepril (LOTREL) 10-20 MG per capsule Take 1 capsule by mouth daily.        . cetirizine (ZYRTEC) 10 MG tablet Take 10 mg by mouth daily.       . cholecalciferol (VITAMIN D) 1000 UNITS tablet Take 1,000 Units by mouth daily.        . pravastatin (PRAVACHOL) 20 MG tablet Take 20 mg by mouth daily.        Marland Kitchen triamterene-hydrochlorothiazide (MAXZIDE-25)  37.5-25 MG per tablet Take 1 tablet by mouth daily.         No facility-administered medications prior to visit.     Review of Systems  Constitutional: Negative for fever, chills, diaphoresis, activity change, appetite change, fatigue and unexpected weight change.  HENT: Negative for congestion, dental problem, ear discharge, ear pain, facial swelling, hearing loss, mouth sores, nosebleeds, postnasal drip, rhinorrhea, sinus pressure, sneezing, sore throat, tinnitus, trouble swallowing and voice change.   Eyes: Negative for photophobia, discharge, itching and visual disturbance.  Respiratory: Negative for apnea, cough, choking, chest tightness, shortness of breath, wheezing and stridor.   Cardiovascular: Negative for chest pain, palpitations and leg swelling.  Gastrointestinal: Negative for nausea, vomiting, abdominal  pain, constipation, blood in stool and abdominal distention.  Genitourinary: Negative for dysuria, urgency, frequency, hematuria, flank pain, decreased urine volume and difficulty urinating.  Musculoskeletal: Negative for arthralgias, back pain, gait problem, joint swelling, myalgias, neck pain and neck stiffness.  Skin: Negative for color change, pallor and rash.  Neurological: Negative for dizziness, tremors, seizures, syncope, speech difficulty, weakness, light-headedness, numbness and headaches.  Hematological: Negative for adenopathy. Does not bruise/bleed easily.  Psychiatric/Behavioral: Negative for confusion, sleep disturbance and agitation. The patient is not nervous/anxious.        Objective:   Physical Exam  Filed Vitals:   11/22/13 1442  BP: 114/70  Pulse: 79  Temp: 97.9 F (36.6 C)  TempSrc: Oral  Height: 5\' 4"  (1.626 m)  Weight: 149 lb 3.2 oz (67.677 kg)  SpO2: 97%    Gen: Pleasant, well-nourished, in no distress,  normal affect  ENT: No lesions,  mouth clear,  oropharynx clear, no postnasal drip  Neck: No JVD, no TMG, no carotid bruits  Lungs: No use of accessory muscles, no dullness to percussion, clear without rales or rhonchi  Cardiovascular: RRR, heart sounds normal, no murmur or gallops, no peripheral edema  Abdomen: soft and NT, no HSM,  BS normal  Musculoskeletal: No deformities, no cyanosis or clubbing  Neuro: alert, non focal  Skin: Warm, no lesions or rashes   08/04/11 CT Chest Findings: Ascending aortic aneurysm noted. Aortic root measures  4.2 cm transverse. Mid ascending aorta measures 4.4 cm. Anterior  arch measures 3.8 cm. Descending arch measures 3.2 cm. Descending  mid thoracic aorta measures 2.2 cm. At the hiatus the aorta  measures 2.3 cm. There is mild atherosclerotic calcification of  the aortic arch and descending thoracic aorta. No definite  pathologic thoracic adenopathy identified.  Slight nodularity in the thyroid gland noted  on the left side, with  a 6 mm nodule suspected on image 5 of series 3.  No pericardial effusion identified.  No cardiac valvular calcifications noted. A slightly spiculated  1.2 x 1.0 cm nodule is present in the left lower lobe on image 31  of series 4. A pulmonary arterial branch extends directly towards  this lesion.  No mass of the visualized adrenal glands identified.  IMPRESSION:  1. 1.2 x 1.0 cm pulmonary nodule in the left lower lobe is  suspicious for bronchogenic carcinoma. Focal inflammatory process  or possibly vascular malformation would have a similar appearance.  The lesion is amenable to percutaneous biopsy and might be  amendable to endobronchial ultrasound. Alternatively, nuclear  medicine PET CT could be utilized to assess the metabolic activity  of this nodule.  2. Ascending aortic aneurysm.  3. Mild atherosclerotic calcification of the aortic arch and  descending thoracic aorta.    08/19/11 PET scan :  Neg for significant uptake in LLL nodule  CT scan 2014: The left lower lobe lesion is now 1.4 x 1.4 cm in diameter  no other nodules are seen    Assessment & Plan:   Pulmonary nodule, left This patient has an increasing Lee larger left lower lobe pulmonary nodule which now measures 1.4 x 1.4 cm and is very concerning for a low grade adenocarcinoma. Even though this patient is a lifelong never smoker and had a negative PET scan I'm very concerned this is a slow-growing malignancy in left lower lobe. The patient is very reluctant to pursue a surgical option at this time.  Plan We'll refer this patient to one of my partners for consideration of electronic navigational bronchoscopy to see if this lesion can be biopsied if not may need to consider a surgical referral  The patient is preferring to wait and repeat CT scan in 3 months I told her this may not be a very good option    Updated Medication List Outpatient Encounter Prescriptions as of 11/22/2013   Medication Sig  . amLODipine-benazepril (LOTREL) 10-20 MG per capsule Take 1 capsule by mouth daily.    Marland Kitchen azelastine (ASTELIN) 137 MCG/SPRAY nasal spray Place 1 spray into both nostrils daily.  . cetirizine (ZYRTEC) 10 MG tablet Take 10 mg by mouth daily.   . chlorpheniramine-HYDROcodone (TUSSIONEX) 10-8 MG/5ML LQCR as needed.  . cholecalciferol (VITAMIN D) 1000 UNITS tablet Take 1,000 Units by mouth daily.    . pravastatin (PRAVACHOL) 20 MG tablet Take 20 mg by mouth daily.    Marland Kitchen triamterene-hydrochlorothiazide (MAXZIDE-25) 37.5-25 MG per tablet Take 1 tablet by mouth daily.

## 2013-11-23 ENCOUNTER — Other Ambulatory Visit: Payer: Self-pay | Admitting: Emergency Medicine

## 2013-11-23 DIAGNOSIS — R911 Solitary pulmonary nodule: Secondary | ICD-10-CM

## 2013-11-23 NOTE — Assessment & Plan Note (Signed)
This patient has an increasing Lee larger left lower lobe pulmonary nodule which now measures 1.4 x 1.4 cm and is very concerning for a low grade adenocarcinoma. Even though this patient is a lifelong never smoker and had a negative PET scan I'm very concerned this is a slow-growing malignancy in left lower lobe. The patient is very reluctant to pursue a surgical option at this time.  Plan We'll refer this patient to one of my partners for consideration of electronic navigational bronchoscopy to see if this lesion can be biopsied if not may need to consider a surgical referral  The patient is preferring to wait and repeat CT scan in 3 months I told her this may not be a very good option

## 2013-11-29 ENCOUNTER — Other Ambulatory Visit: Payer: Self-pay | Admitting: Emergency Medicine

## 2013-11-29 DIAGNOSIS — R911 Solitary pulmonary nodule: Secondary | ICD-10-CM

## 2013-11-30 ENCOUNTER — Telehealth: Payer: Self-pay | Admitting: Emergency Medicine

## 2013-11-30 ENCOUNTER — Encounter (HOSPITAL_COMMUNITY): Payer: Self-pay | Admitting: Pharmacy Technician

## 2013-11-30 NOTE — Telephone Encounter (Signed)
Spoke with Jesusita Oka at [employer], they have not yet approve pt's CT of the chest due the fact that the pt just had at CT chest x6 weeks ago This was order by Dr. Donnie Aho and now RB has order one.  RB I need reasoning for repeat CT of chest within 6 week period for insurance to approve. Please advise.  Thanks [employer]

## 2013-11-30 NOTE — Telephone Encounter (Signed)
Called and LM with pt's husband to have Katrina Hunter return call.

## 2013-12-01 ENCOUNTER — Ambulatory Visit (INDEPENDENT_AMBULATORY_CARE_PROVIDER_SITE_OTHER)
Admission: RE | Admit: 2013-12-01 | Discharge: 2013-12-01 | Disposition: A | Discharge status: Home / Self Care | Payer: Medicare HMO | Source: Ambulatory Visit | Attending: Emergency Medicine | Admitting: Emergency Medicine

## 2013-12-01 DIAGNOSIS — R911 Solitary pulmonary nodule: Secondary | ICD-10-CM

## 2013-12-02 ENCOUNTER — Encounter (HOSPITAL_COMMUNITY): Payer: Self-pay

## 2013-12-02 ENCOUNTER — Encounter (HOSPITAL_COMMUNITY)
Admission: RE | Admit: 2013-12-02 | Discharge: 2013-12-02 | Disposition: A | Discharge status: Home / Self Care | Payer: Medicare HMO | Source: Ambulatory Visit | Attending: Emergency Medicine | Admitting: Emergency Medicine

## 2013-12-02 ENCOUNTER — Ambulatory Visit (HOSPITAL_COMMUNITY)
Admission: RE | Admit: 2013-12-02 | Discharge: 2013-12-02 | Disposition: A | Discharge status: Home / Self Care | Payer: Medicare HMO | Source: Ambulatory Visit | Attending: Emergency Medicine | Admitting: Emergency Medicine

## 2013-12-02 DIAGNOSIS — Z01812 Encounter for preprocedural laboratory examination: Secondary | ICD-10-CM | POA: Insufficient documentation

## 2013-12-02 DIAGNOSIS — Z01818 Encounter for other preprocedural examination: Secondary | ICD-10-CM | POA: Insufficient documentation

## 2013-12-02 HISTORY — DX: Presence of cardiac pacemaker: Z95.0

## 2013-12-02 HISTORY — DX: Cardiac murmur, unspecified: R01.1

## 2013-12-02 HISTORY — DX: Aneurysm of unspecified site: I72.9

## 2013-12-02 LAB — CBC
MCH: 29.3 pg (ref 26.0–34.0)
MCV: 86.1 fL (ref 78.0–100.0)
Platelets: 296 10*3/uL (ref 150–400)
RDW: 13.3 % (ref 11.5–15.5)
WBC: 6.3 10*3/uL (ref 4.0–10.5)

## 2013-12-02 LAB — COMPREHENSIVE METABOLIC PANEL
AST: 21 U/L (ref 0–37)
Albumin: 4.2 g/dL (ref 3.5–5.2)
BUN: 20 mg/dL (ref 6–23)
Calcium: 9.8 mg/dL (ref 8.4–10.5)
Creatinine, Ser: 0.98 mg/dL (ref 0.50–1.10)
GFR calc Af Amer: 65 mL/min — ABNORMAL LOW (ref >90)
Potassium: 3.6 mEq/L (ref 3.5–5.1)
Total Bilirubin: 0.7 mg/dL (ref 0.3–1.2)
Total Protein: 8.1 g/dL (ref 6.0–8.3)

## 2013-12-02 LAB — PROTIME-INR: INR: 0.95 (ref 0.00–1.49)

## 2013-12-02 NOTE — Progress Notes (Addendum)
req'd notes ( cardiac status, aneurysm), ekg, any tests, from dr Donnie Aho Pacer orders faxed to dr taylor

## 2013-12-02 NOTE — Progress Notes (Signed)
Dr called for orders by Jamesetta Orleans

## 2013-12-02 NOTE — Progress Notes (Signed)
Anesthesia Chart Review:  Patient is a 72 year old female scheduled for video bronchoscopy with electromagnetic navigation and biopsies on 12/05/13 by Dr. Delton Coombes for evaluation of a lung nodule.  PAT was on 12/02/13.  Chart was brought to me to review at 4:00 PM.  PPM perioperative form is still pending from Dr. Donnie Aho.  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 PCP is Dr. Bethann Punches. Primary pulmonologist is Dr. Shan Levans.  Cardiologist is Dr. Viann Fish, last office visit 10/17/13.  He is the one who referred patient to Dr. Delford Field to biopsy.  EKG on 10/17/13 showed V-paced rhythm, SR.  So far, no echo report received from Dr. York Spaniel office.  His note indicates that a prior echo was done in 07/30/2011 and showed an LVEF of 60%.  CXR on 12/02/13 showed:  1. Hyperexpanded lungs and bronchitic change without acute cardiopulmonary disease.  2. Ill-defined heterogeneous nodular opacity within the left mid/lower lung likely correlates with the ground-glass nodule seen on recently performed chest CT.  CT Super D chest CT w/o contrast on 12/01/13 showed: 1. Previously noted subsolid nodule in the left lower lobe is unchanged in overall size. Notably, however, on today's thin slab high-resolution images, the central solid component of this nodule does measure up to 14 mm in total length. While this could represent a post infectious or inflammatory scar, the possibility of a slow-growing adenocarcinoma is not excluded.  2. Atherosclerosis, including left main coronary artery disease. Mild aneurysmal dilatation of the ascending thoracic aorta (4.7 cm in diameter) is unchanged. Assessment for potential risk factor modification, dietary therapy or pharmacologic therapy may be warranted, if clinically indicated.   Preoperative labs noted.  Atherosclerosis on  CT reviewed with anesthesiologist Dr. Krista Blue. She is followed by Dr. Donnie Aho with recent visit. He referred patient for biopsy.  If she remains asymptomatic from a CV standpoint then it is anticipated that she can proceed as planned.  Vel[facility] Short Stay Center/Anesthesiology Phone [phone] 12/02/2013 4:36 PM

## 2013-12-02 NOTE — Pre-Procedure Instructions (Addendum)
Katrina Hunter  12/02/2013   Your procedure is scheduled on:  12/05/13  Report to [facility] short stay admitting at 530 AM.  Call this number if you have problems the morning of surgery: [phone]   Remember:   Do not eat food or drink liquids after midnight.   Take these medicines the morning of surgery with A SIP OF WATER: amlodipine, zyrtec        STOP all herbel meds, nsaids (aleve,naproxen,advil,ibuprofen)  including aspirin, vitamins now    Do not wear jewelry, make-up or nail polish.  Do not wear lotions, powders, or perfumes. You may wear deodorant.  Do not shave 48 hours prior to surgery. Men may shave face and neck.  Do not bring valuables to the hospital.  [facility] is not responsible                  for any belongings or valuables.               Contacts, dentures or bridgework may not be worn into surgery.  Leave suitcase in the car. After surgery it may be brought to your room.  For patients admitted to the hospital, discharge time is determined by your                treatment team.               Patients discharged the day of surgery will not be allowed to drive  home.  Name and phone number of your driver:   Special Instructions: Shower using CHG 2 nights before surgery and the night before surgery.  If you shower the day of surgery use CHG.  Use special wash - you have one bottle of CHG for all showers.  You should use approximately 1/3 of the bottle for each shower.   Please read over the following fact sheets that you were given: Pain Booklet, Coughing and Deep Breathing and Surgical Site Infection Prevention

## 2013-12-05 ENCOUNTER — Encounter (HOSPITAL_COMMUNITY): Payer: Self-pay | Admitting: Critical Care Medicine

## 2013-12-05 ENCOUNTER — Ambulatory Visit (HOSPITAL_COMMUNITY): Payer: Medicare HMO

## 2013-12-05 ENCOUNTER — Encounter (HOSPITAL_COMMUNITY)
Admission: RE | Disposition: A | Discharge status: Home / Self Care | Payer: Self-pay | Source: Ambulatory Visit | Attending: Emergency Medicine

## 2013-12-05 ENCOUNTER — Ambulatory Visit (HOSPITAL_COMMUNITY): Payer: Medicare HMO | Admitting: Critical Care Medicine

## 2013-12-05 ENCOUNTER — Encounter (HOSPITAL_COMMUNITY): Payer: Medicare HMO | Admitting: Vascular Surgery

## 2013-12-05 ENCOUNTER — Ambulatory Visit (HOSPITAL_COMMUNITY)
Admission: RE | Admit: 2013-12-05 | Discharge: 2013-12-05 | Disposition: A | Discharge status: Home / Self Care | Payer: Medicare HMO | Source: Ambulatory Visit | Attending: Emergency Medicine | Admitting: Emergency Medicine

## 2013-12-05 DIAGNOSIS — E039 Hypothyroidism, unspecified: Secondary | ICD-10-CM | POA: Insufficient documentation

## 2013-12-05 DIAGNOSIS — E785 Hyperlipidemia, unspecified: Secondary | ICD-10-CM | POA: Insufficient documentation

## 2013-12-05 DIAGNOSIS — I712 Thoracic aortic aneurysm, without rupture, unspecified: Secondary | ICD-10-CM | POA: Insufficient documentation

## 2013-12-05 DIAGNOSIS — Z79899 Other long term (current) drug therapy: Secondary | ICD-10-CM | POA: Insufficient documentation

## 2013-12-05 DIAGNOSIS — Z950 Presence of cardiac pacemaker: Secondary | ICD-10-CM | POA: Insufficient documentation

## 2013-12-05 DIAGNOSIS — I10 Essential (primary) hypertension: Secondary | ICD-10-CM | POA: Insufficient documentation

## 2013-12-05 DIAGNOSIS — R911 Solitary pulmonary nodule: Secondary | ICD-10-CM | POA: Diagnosis present

## 2013-12-05 HISTORY — PX: VIDEO BRONCHOSCOPY WITH ENDOBRONCHIAL NAVIGATION: SHX6175

## 2013-12-05 SURGERY — VIDEO BRONCHOSCOPY WITH ENDOBRONCHIAL NAVIGATION
Anesthesia: General

## 2013-12-05 MED ORDER — MIDAZOLAM HCL 5 MG/5ML IJ SOLN
INTRAMUSCULAR | Status: DC | PRN
  Administered 2013-12-05: 1 mg via INTRAVENOUS

## 2013-12-05 MED ORDER — PROPOFOL 10 MG/ML IV BOLUS
INTRAVENOUS | Status: DC | PRN
  Administered 2013-12-05: 120 mg via INTRAVENOUS

## 2013-12-05 MED ORDER — LIDOCAINE HCL (CARDIAC) 20 MG/ML IV SOLN
INTRAVENOUS | Status: DC | PRN
  Administered 2013-12-05: 30 mg via INTRAVENOUS

## 2013-12-05 MED ORDER — FENTANYL CITRATE 0.05 MG/ML IJ SOLN
INTRAMUSCULAR | Status: DC | PRN
  Administered 2013-12-05: 100 ug via INTRAVENOUS

## 2013-12-05 MED ORDER — HYDROMORPHONE HCL PF 1 MG/ML IJ SOLN: 0.2500 mg | INTRAMUSCULAR | Status: DC | PRN

## 2013-12-05 MED ORDER — NEOSTIGMINE METHYLSULFATE 1 MG/ML IJ SOLN
INTRAMUSCULAR | Status: DC | PRN
  Administered 2013-12-05: 4 mg via INTRAVENOUS

## 2013-12-05 MED ORDER — ONDANSETRON HCL 4 MG/2ML IJ SOLN
INTRAMUSCULAR | Status: DC | PRN
  Administered 2013-12-05: 4 mg via INTRAVENOUS

## 2013-12-05 MED ORDER — DEXAMETHASONE SODIUM PHOSPHATE 4 MG/ML IJ SOLN
INTRAMUSCULAR | Status: DC | PRN
  Administered 2013-12-05: 4 mg via INTRAVENOUS

## 2013-12-05 MED ORDER — ONDANSETRON HCL 4 MG/2ML IJ SOLN: 4.0000 mg | Freq: Once | INTRAMUSCULAR | Status: DC | PRN

## 2013-12-05 MED ORDER — GLYCOPYRROLATE 0.2 MG/ML IJ SOLN
INTRAMUSCULAR | Status: DC | PRN
  Administered 2013-12-05: 0.6 mg via INTRAVENOUS

## 2013-12-05 MED ORDER — LACTATED RINGERS IV SOLN
INTRAVENOUS | Status: DC | PRN
  Administered 2013-12-05 (×2): via INTRAVENOUS

## 2013-12-05 MED ORDER — 0.9 % SODIUM CHLORIDE (POUR BTL) OPTIME
TOPICAL | Status: DC | PRN
  Administered 2013-12-05: 1000 mL

## 2013-12-05 MED ORDER — PHENYLEPHRINE HCL 10 MG/ML IJ SOLN
INTRAMUSCULAR | Status: DC | PRN
  Administered 2013-12-05: 80 ug via INTRAVENOUS
  Administered 2013-12-05: 160 ug via INTRAVENOUS
  Administered 2013-12-05: 80 ug via INTRAVENOUS

## 2013-12-05 MED ORDER — EPHEDRINE SULFATE 50 MG/ML IJ SOLN
INTRAMUSCULAR | Status: DC | PRN
  Administered 2013-12-05: 10 mg via INTRAVENOUS
  Administered 2013-12-05 (×3): 5 mg via INTRAVENOUS

## 2013-12-05 MED ORDER — ROCURONIUM BROMIDE 100 MG/10ML IV SOLN
INTRAVENOUS | Status: DC | PRN
  Administered 2013-12-05: 40 mg via INTRAVENOUS

## 2013-12-05 SURGICAL SUPPLY — 32 items
BRUSH CYTOL CELLEBRITY 1.5X140 (MISCELLANEOUS) ×2 IMPLANT
BRUSH SUPERTRAX BIOPSY (INSTRUMENTS) IMPLANT
BRUSH SUPERTRAX NDL-TIP CYTO (INSTRUMENTS) IMPLANT
CANISTER SUCTION 2500CC (MISCELLANEOUS) ×2 IMPLANT
CHANNEL WORK EXTEND EDGE 180 (KITS) IMPLANT
CHANNEL WORK EXTEND EDGE 45 (KITS) IMPLANT
CHANNEL WORK EXTEND EDGE 90 (KITS) IMPLANT
CLOTH BEACON ORANGE TIMEOUT ST (SAFETY) ×2 IMPLANT
CONT SPEC 4OZ CLIKSEAL STRL BL (MISCELLANEOUS) ×2 IMPLANT
COVER TABLE BACK 60X90 (DRAPES) ×2 IMPLANT
FILTER STRAW FLUID ASPIR (MISCELLANEOUS) IMPLANT
FORCEPS BIOP SUPERTRX PREMAR (INSTRUMENTS) IMPLANT
GLOVE BIOGEL M STRL SZ7.5 (GLOVE) ×4 IMPLANT
KIT LOCATABLE GUIDE (CANNULA) IMPLANT
KIT MARKER FIDUCIAL DELIVERY (KITS) IMPLANT
KIT PROCEDURE EDGE 180 (KITS) IMPLANT
KIT PROCEDURE EDGE 45 (KITS) IMPLANT
KIT PROCEDURE EDGE 90 (KITS) ×2 IMPLANT
KIT ROOM TURNOVER OR (KITS) ×2 IMPLANT
MARKER SKIN DUAL TIP RULER LAB (MISCELLANEOUS) ×2 IMPLANT
NEEDLE SUPERTRX PREMARK BIOPSY (NEEDLE) IMPLANT
NS IRRIG 1000ML POUR BTL (IV SOLUTION) ×2 IMPLANT
OIL SILICONE PENTAX (PARTS (SERVICE/REPAIRS)) ×2 IMPLANT
PAD ARMBOARD 7.5X6 YLW CONV (MISCELLANEOUS) ×4 IMPLANT
PATCHES PATIENT (LABEL) ×2 IMPLANT
SPONGE GAUZE 4X4 12PLY (GAUZE/BANDAGES/DRESSINGS) ×2 IMPLANT
SYR 20CC LL (SYRINGE) ×2 IMPLANT
SYR 20ML ECCENTRIC (SYRINGE) ×2 IMPLANT
SYRINGE TOOMEY DISP (SYRINGE) ×2 IMPLANT
TOWEL OR 17X24 6PK STRL BLUE (TOWEL DISPOSABLE) ×2 IMPLANT
TRAP SPECIMEN MUCOUS 40CC (MISCELLANEOUS) ×2 IMPLANT
TUBE CONNECTING 12X1/4 (SUCTIONS) ×2 IMPLANT

## 2013-12-05 NOTE — Progress Notes (Signed)
Still waiting on xray report. Called xray dept again

## 2013-12-05 NOTE — Anesthesia Postprocedure Evaluation (Signed)
  Anesthesia Post-op Note  Patient: ELMINA HENDEL  Procedure(s) Performed: Procedure(s): VIDEO BRONCHOSCOPY WITH ENDOBRONCHIAL NAVIGATION (N/A)  Patient Location: PACU  Anesthesia Type:General  Level of Consciousness: awake, alert  and oriented  Airway and Oxygen Therapy: Patient Spontanous Breathing and Patient connected to nasal cannula oxygen  Post-op Pain: mild  Post-op Assessment: Post-op Vital signs reviewed, Patient's Cardiovascular Status Stable, Respiratory Function Stable, Patent Airway and Pain level controlled  Post-op Vital Signs: stable  Complications: No apparent anesthesia complications

## 2013-12-05 NOTE — Anesthesia Procedure Notes (Signed)
Procedure Name: Intubation Date/Time: 12/05/2013 7:32 AM Performed by: Elon Alas Pre-anesthesia Checklist: Patient identified, Timeout performed, Emergency Drugs available, Suction available and Patient being monitored Patient Re-evaluated:Patient Re-evaluated prior to inductionOxygen Delivery Method: Circle system utilized Preoxygenation: Pre-oxygenation with 100% oxygen Intubation Type: IV induction Ventilation: Mask ventilation without difficulty Laryngoscope Size: Mac and 3 Grade View: Grade III Tube type: Oral Tube size: 8.5 mm Number of attempts: 1 Airway Equipment and Method: Stylet Placement Confirmation: positive ETCO2,  ETT inserted through vocal cords under direct vision and breath sounds checked- equal and bilateral Secured at: 21 cm Tube secured with: Tape Dental Injury: Teeth and Oropharynx as per pre-operative assessment

## 2013-12-05 NOTE — Preoperative (Signed)
Beta Blockers   Reason not to administer Beta Blockers:Not Applicable 

## 2013-12-05 NOTE — Progress Notes (Signed)
Still waiting on chest xray report --called xray dept.

## 2013-12-05 NOTE — Interval H&P Note (Signed)
PCCM Interval Note  S: Asymptomatic enlarging LLL pulm nodule seen by Dr Delford Field. Pt presents for FOB and bx's using ENB. She understands the procedure. All questions answered this am. She denies any cardiopulm sx. Her significant hx includes AV block with pacer.   O: Filed Vitals:   12/05/13 0608  BP: 149/75  Pulse: 67  Temp: 97.8 F (36.6 C)  TempSrc: Oral  Resp: 18  SpO2: 98%   Gen: Pleasant, well-nourished, in no distress,  normal affect  ENT: No lesions,  mouth clear,  oropharynx clear, no postnasal drip  Neck: No JVD, no TMG, no carotid bruits  Lungs: No use of accessory muscles, no dullness to percussion, clear without rales or rhonchi  Cardiovascular: RRR, heart sounds normal, no murmur or gallops, no peripheral edema  Abdomen: soft and NT, no HSM,  BS normal  Musculoskeletal: No deformities, no cyanosis or clubbing  Neuro: alert, non focal  Skin: Warm, no lesions or rashes   Recent Labs Lab 12/02/13 1339  HGB 13.3  HCT 39.1  WBC 6.3  PLT 296    Recent Labs Lab 12/02/13 1339  NA 137  K 3.6  CL 99  CO2 25  GLUCOSE 91  BUN 20  CREATININE 0.98  CALCIUM 9.8    Recent Labs Lab 12/02/13 1339  INR 0.95   CT chest 12/02/13 --  IMPRESSION:  1. Previously noted subsolid nodule in the left lower lobe is  unchanged in overall size. Notably, however, on today's thin slab  high-resolution images, the central solid component of this nodule  does measure up to 14 mm in total length. While this could represent  a post infectious or inflammatory scar, the possibility of a  slow-growing adenocarcinoma is not excluded.  2. Atherosclerosis, including left main coronary artery disease.  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 Assessment for potential risk factor  modification, dietary therapy or pharmacologic therapy may be  warranted, if clinically indicated.   A/P: LLL nodule, slowly enlarging. Plan  for FOB and bx's today. Pt understands procedure, risks, benefits and elects to proceed.   Levy Pupa, MD, PhD 12/05/2013, 7:19 AM [facility] [phone] or if no answer [phone]

## 2013-12-05 NOTE — Op Note (Signed)
Video Bronchoscopy with Electromagnetic Navigation Procedure Note  Date of Operation: 12/05/2013  Pre-op Diagnosis: LLL nodule  Post-op Diagnosis: same  Surgeon: Levy Pupa  Assistants: none  Anesthesia: General endotracheal anesthesia  Operation: Flexible video fiberoptic bronchoscopy with electromagnetic navigation and biopsies.  Estimated Blood Loss: Minimal  Complications: none apparent  Indications and History: Katrina Hunter is a 72 y.o. female with LLL nodule. Recommendation was made to pursue biopsy via navigation bronchoscopy.  The risks, benefits, complications, treatment options and expected outcomes were discussed with the patient.  The possibilities of pneumothorax, pneumonia, reaction to medication, pulmonary aspiration, perforation of a viscus, bleeding, failure to diagnose a condition and creating a complication requiring transfusion or operation were discussed with the patient who freely signed the consent.    Description of Procedure: The patient was seen in the Preoperative Area, was examined and was deemed appropriate to proceed.  The patient was taken to [facility] OR10, identified as Katrina Hunter and the procedure verified as Flexible Video Fiberoptic Bronchoscopy.  A Time Out was held and the above information confirmed.   Prior to the date of the procedure a high-resolution CT scan of the chest was performed. Utilizing superDimension software a virtual tracheobronchial tree was generated to allow the creation of distinct navigation pathways to the patient's parenchymal abnormalities. After being taken to the operating room general anesthesia was initiated and the patient  was orally intubated. The video fiberoptic bronchoscope was introduced via the endotracheal tube and a general inspection was performed which showed normal airways throughout. The extendable working channel and locator guide were introduced into the bronchoscope. The distinct navigation pathway  prepared prior to this procedure were then utilized to navigate to within 0.3 to 1.2 cm of patient's lesion identified on CT scan. The extendable working channel was secured into place and the locator guide was withdrawn. Under fluoroscopic guidance transbronchial needle brushings, transbronchial Wang needle biopsies, and transbronchial forceps biopsies were performed to be sent for cytology and pathology. A bronchioalveolar lavage was performed in the LLL and sent for cytology and microbiology (bacterial, fungal, AFB smears and cultures). At the end of the procedure a general airway inspection was performed and there was no evidence of active bleeding. The bronchoscope was removed.  The patient tolerated the procedure well. There was no significant blood loss and there were no obvious complications. A post-procedural chest x-ray is pending.  Samples: 1. Transbronchial needle brushings from LLL nodule 2. Transbronchial Wang needle biopsies from LLL nodule 3. Transbronchial forceps biopsies from LLL nodule 4. Bronchoalveolar lavage from LLL  Plans:  The patient will be discharged from the PACU to home when recovered from anesthesia and after chest x-ray is reviewed. We will review the cytology, pathology and microbiology results with the patient when they become available. Outpatient followup will be with Dr Delton Coombes or Dr Delford Field.    Levy Pupa, MD, PhD 12/05/2013, 8:42 AM [facility] [phone] or if no answer [phone]

## 2013-12-05 NOTE — Anesthesia Preprocedure Evaluation (Addendum)
Anesthesia Evaluation  Patient identified by MRN, date of birth, ID band Patient awake    Reviewed: Allergy & Precautions, H&P , NPO status   Airway Mallampati: II TM Distance: >3 FB Neck ROM: Full    Dental  (+) Dental Advisory Given and Teeth Intact   Pulmonary  Pulmonary nodule breath sounds clear to auscultation        Cardiovascular hypertension, Pt. on medications + dysrhythmias + pacemaker Rhythm:Regular Rate:Normal  4.7cm thoracic aortic aneurysm From cardiology note EF 60% (2012)   Neuro/Psych    GI/Hepatic   Endo/Other  Hypothyroidism   Renal/GU      Musculoskeletal   Abdominal   Peds  Hematology   Anesthesia Other Findings   Reproductive/Obstetrics                         Anesthesia Physical Anesthesia Plan  ASA: III  Anesthesia Plan: General   Post-op Pain Management:    Induction: Intravenous  Airway Management Planned: Oral ETT  Additional Equipment:   Intra-op Plan:   Post-operative Plan: Extubation in OR  Informed Consent: I have reviewed the patients History and Physical, chart, labs and discussed the procedure including the risks, benefits and alternatives for the proposed anesthesia with the patient or authorized representative who has indicated his/her understanding and acceptance.   Dental advisory given  Plan Discussed with: Anesthesiologist and Surgeon  Anesthesia Plan Comments:         Anesthesia Quick Evaluation

## 2013-12-05 NOTE — Transfer of Care (Signed)
Immediate Anesthesia Transfer of Care Note  Patient: Katrina Hunter  Procedure(s) Performed: Procedure(s): VIDEO BRONCHOSCOPY WITH ENDOBRONCHIAL NAVIGATION (N/A)  Patient Location: PACU  Anesthesia Type:General  Level of Consciousness: awake, alert  and oriented  Airway & Oxygen Therapy: Patient Spontanous Breathing and Patient connected to nasal cannula oxygen  Post-op Assessment: Report given to PACU RN, Post -op Vital signs reviewed and stable and Patient moving all extremities X 4  Post vital signs: Reviewed and stable  Complications: No apparent anesthesia complications

## 2013-12-05 NOTE — H&P (View-Only) (Signed)
Subjective:    Patient ID: Katrina Hunter, female    DOB: 04/07/1941, 72 y.o.   MRN: [id number]  HPI   72 y.o.WF Pt has a nodule on lung. Found on routine preop CXR for PPM.  Had bronchitis one year and had CXR many years ago, not recently. No real pulm symptoms. Pt denies any significant sore throat, nasal congestion or excess secretions, fever, chills, sweats, unintended weight loss, pleurtic or exertional chest pain, orthopnea PND, or leg swelling Pt denies any increase in rescue therapy over baseline, denies waking up needing it or having any early am or nocturnal exacerbations of coughing/wheezing/or dyspnea. Pt also denies any obvious fluctuation in symptoms with  weather or environmental change or other alleviating or aggravating factors  Had allergies and allergic rhinitis but zyrtec helps this. 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 No TB exposure  11/2011 Endurance is better after PPM .  BP is now lower.  Less dyspnea.  F/u LLL nodule with neg PET scan No mucus.  No chest pain.  No f/c/s.  No pulm symptoms .   11/22/2013 Chief Complaint  Patient presents with  . Follow-up    to discuss CT Chest results.   Has runny nose with clear drainange and sneezing. No SOB, wheezing, chest tightness/pain, or cough.    No increased cough, No chest pain. No allergies. Notes a runny nose. Cannot take a decongestant.  No ABX No hemoptysis. Life long never smoker.   The patient originally had a CT scan of the chest in 2012 showing a small left lower lobe nodule unfortunately this lesion has gotten larger over the ensuing 2 years and is now 1.4 x 1.4 cm. No PET scan in 2012 did not show any uptake. This still does not rule out a low-grade malignancy.  Past Medical History  Diagnosis Date  . 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 HTN (hypertension)   . HLD (hyperlipidemia)   . Hypothyroidism      Family History   Problem Relation Age of Onset  . Heart disease Father   . Breast cancer Mother      History   Social History  . Marital Status: Married    Spouse Name: N/A    Number of Children: 3  . Years of Education: N/A   Occupational History  . banker   .     Social History Main Topics  . Smoking status: Never Smoker   . Smokeless tobacco: Never Used  . Alcohol Use: No  . Drug Use: Not on file  . Sexual Activity: Not on file   Other Topics Concern  . Not on file   Social History Narrative  . No narrative on file     Allergies  Allergen Reactions  . Crestor [Rosuvastatin Calcium]     myalgia  . Sulfonamide Derivatives      Outpatient Prescriptions Prior to Visit  Medication Sig Dispense Refill  . amLODipine-benazepril (LOTREL) 10-20 MG per capsule Take 1 capsule by mouth daily.        . cetirizine (ZYRTEC) 10 MG tablet Take 10 mg by mouth daily.       . cholecalciferol (VITAMIN D) 1000 UNITS tablet Take 1,000 Units by mouth daily.        . pravastatin (PRAVACHOL) 20 MG tablet Take 20 mg by mouth daily.        . triamterene-hydrochlorothiazide (MAXZIDE-25)   37.5-25 MG per tablet Take 1 tablet by mouth daily.         No facility-administered medications prior to visit.     Review of Systems  Constitutional: Negative for fever, chills, diaphoresis, activity change, appetite change, fatigue and unexpected weight change.  HENT: Negative for congestion, dental problem, ear discharge, ear pain, facial swelling, hearing loss, mouth sores, nosebleeds, postnasal drip, rhinorrhea, sinus pressure, sneezing, sore throat, tinnitus, trouble swallowing and voice change.   Eyes: Negative for photophobia, discharge, itching and visual disturbance.  Respiratory: Negative for apnea, cough, choking, chest tightness, shortness of breath, wheezing and stridor.   Cardiovascular: Negative for chest pain, palpitations and leg swelling.  Gastrointestinal: Negative for nausea, vomiting, abdominal  pain, constipation, blood in stool and abdominal distention.  Genitourinary: Negative for dysuria, urgency, frequency, hematuria, flank pain, decreased urine volume and difficulty urinating.  Musculoskeletal: Negative for arthralgias, back pain, gait problem, joint swelling, myalgias, neck pain and neck stiffness.  Skin: Negative for color change, pallor and rash.  Neurological: Negative for dizziness, tremors, seizures, syncope, speech difficulty, weakness, light-headedness, numbness and headaches.  Hematological: Negative for adenopathy. Does not bruise/bleed easily.  Psychiatric/Behavioral: Negative for confusion, sleep disturbance and agitation. The patient is not nervous/anxious.        Objective:   Physical Exam  Filed Vitals:   11/22/13 1442  BP: 114/70  Pulse: 79  Temp: 97.9 F (36.6 C)  TempSrc: Oral  Height: 5' 4" (1.626 m)  Weight: 149 lb 3.2 oz (67.677 kg)  SpO2: 97%    Gen: Pleasant, well-nourished, in no distress,  normal affect  ENT: No lesions,  mouth clear,  oropharynx clear, no postnasal drip  Neck: No JVD, no TMG, no carotid bruits  Lungs: No use of accessory muscles, no dullness to percussion, clear without rales or rhonchi  Cardiovascular: RRR, heart sounds normal, no murmur or gallops, no peripheral edema  Abdomen: soft and NT, no HSM,  BS normal  Musculoskeletal: No deformities, no cyanosis or clubbing  Neuro: alert, non focal  Skin: Warm, no lesions or rashes   08/04/11 CT Chest Findings: Ascending aortic aneurysm noted. Aortic root measures  4.2 cm transverse. Mid ascending aorta measures 4.4 cm. Anterior  arch measures 3.8 cm. Descending arch measures 3.2 cm. Descending  mid thoracic aorta measures 2.2 cm. At the hiatus the aorta  measures 2.3 cm. There is mild atherosclerotic calcification of  the aortic arch and descending thoracic aorta. No definite  pathologic thoracic adenopathy identified.  Slight nodularity in the thyroid gland noted  on the left side, with  a 6 mm nodule suspected on image 5 of series 3.  No pericardial effusion identified.  No cardiac valvular calcifications noted. A slightly spiculated  1.2 x 1.0 cm nodule is present in the left lower lobe on image 31  of series 4. A pulmonary arterial branch extends directly towards  this lesion.  No mass of the visualized adrenal glands identified.  IMPRESSION:  1. 1.2 x 1.0 cm pulmonary nodule in the left lower lobe is  suspicious for bronchogenic carcinoma. Focal inflammatory process  or possibly vascular malformation would have a similar appearance.  The lesion is amenable to percutaneous biopsy and might be  amendable to endobronchial ultrasound. Alternatively, nuclear  medicine PET CT could be utilized to assess the metabolic activity  of this nodule.  2. Ascending aortic aneurysm.  3. Mild atherosclerotic calcification of the aortic arch and  descending thoracic aorta.    08/19/11 PET scan :    Neg for significant uptake in LLL nodule  CT scan 2014: The left lower lobe lesion is now 1.4 x 1.4 cm in diameter  no other nodules are seen    Assessment & Plan:   Pulmonary nodule, left This patient has an increasing Lee larger left lower lobe pulmonary nodule which now measures 1.4 x 1.4 cm and is very concerning for a low grade adenocarcinoma. Even though this patient is a lifelong never smoker and had a negative PET scan I'm very concerned this is a slow-growing malignancy in left lower lobe. The patient is very reluctant to pursue a surgical option at this time.  Plan We'll refer this patient to one of my partners for consideration of electronic navigational bronchoscopy to see if this lesion can be biopsied if not may need to consider a surgical referral  The patient is preferring to wait and repeat CT scan in 3 months I told her this may not be a very good option    Updated Medication List Outpatient Encounter Prescriptions as of 11/22/2013   Medication Sig  . amLODipine-benazepril (LOTREL) 10-20 MG per capsule Take 1 capsule by mouth daily.    . azelastine (ASTELIN) 137 MCG/SPRAY nasal spray Place 1 spray into both nostrils daily.  . cetirizine (ZYRTEC) 10 MG tablet Take 10 mg by mouth daily.   . chlorpheniramine-HYDROcodone (TUSSIONEX) 10-8 MG/5ML LQCR as needed.  . cholecalciferol (VITAMIN D) 1000 UNITS tablet Take 1,000 Units by mouth daily.    . pravastatin (PRAVACHOL) 20 MG tablet Take 20 mg by mouth daily.    . triamterene-hydrochlorothiazide (MAXZIDE-25) 37.5-25 MG per tablet Take 1 tablet by mouth daily.        

## 2013-12-06 ENCOUNTER — Encounter (HOSPITAL_COMMUNITY): Payer: Self-pay | Admitting: Emergency Medicine

## 2013-12-07 ENCOUNTER — Telehealth: Payer: Self-pay | Admitting: Emergency Medicine

## 2013-12-07 LAB — CULTURE, RESPIRATORY W GRAM STAIN: Gram Stain: NONE SEEN

## 2013-12-07 LAB — CULTURE, RESPIRATORY

## 2013-12-07 NOTE — Telephone Encounter (Signed)
Spoke with pt's husband, reviewed bx results with him > all negative for malignancy. Cx data pending but smears negative. Explained to him the possibility of a false negative and that we would need to keep following with serial scans. If the nodule changes then she would need repeat bx or more likely a primary resection. He will relay this to her. We have f/u visit next week to discuss further.

## 2013-12-13 ENCOUNTER — Encounter: Payer: Self-pay | Admitting: Emergency Medicine

## 2013-12-13 ENCOUNTER — Ambulatory Visit (INDEPENDENT_AMBULATORY_CARE_PROVIDER_SITE_OTHER): Payer: Medicare HMO | Admitting: Emergency Medicine

## 2013-12-13 VITALS — BP 160/100 | HR 82 | Ht 64.0 in | Wt 147.8 lb

## 2013-12-13 DIAGNOSIS — R911 Solitary pulmonary nodule: Secondary | ICD-10-CM

## 2013-12-13 NOTE — Assessment & Plan Note (Signed)
Discussed results with her today. The bx are negative. She understands the potential for a false negative. We agreed that she would like to wait and watch, repeat CT scan in June. Will f/u w Dr Delford Field at that time to look for interval change.

## 2013-12-13 NOTE — Progress Notes (Signed)
Subjective:    Patient ID: Katrina Hunter, female    DOB: 09-01-1941, 72 y.o.   MRN: [id number]  HPI  72 y.o.WF Pt has a nodule on lung. Found on routine preop CXR for PPM.  Had bronchitis one year and had CXR many years ago, not recently. No real pulm symptoms. Pt denies any significant sore throat, nasal congestion or excess secretions, fever, chills, sweats, unintended weight loss, pleurtic or exertional chest pain, orthopnea PND, or leg swelling Pt denies any increase in rescue therapy over baseline, denies waking up needing it or having any early am or nocturnal exacerbations of coughing/wheezing/or dyspnea. Pt also denies any obvious fluctuation in symptoms with  weather or environmental change or other alleviating or aggravating factors  Had allergies and allergic rhinitis but zyrtec helps this. 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  BP is now lower.  Less dyspnea.  F/u LLL nodule with neg PET scan No mucus.  No chest pain.  No f/c/s.  No pulm symptoms .   11/22/2013 Chief Complaint  Patient presents with  . Follow-up    to discuss CT Chest results.   Has runny nose with clear drainange and sneezing. No SOB, wheezing, chest tightness/pain, or cough.    No increased cough, No chest pain. No allergies. Notes a runny nose. Cannot take a decongestant.  No ABX No hemoptysis. Life long never smoker.   The patient originally had a CT scan of the chest in 2012 showing a small left lower lobe nodule unfortunately this lesion has gotten larger over the ensuing 2 years and is now 1.4 x 1.4 cm. No PET scan in 2012 did not show any uptake. This still does not rule out a low-grade malignancy.  ROV 12/13/13 -- follows up s/p ENB and bx's of her LLL nodule.  Bx data is negative.  She understands that there is potential for a false negative. . She is well post-procedure, no new  sx  Past Medical History  Diagnosis Date  . 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 HTN (hypertension)   . HLD (hyperlipidemia)   . Pacemaker 12  . Hypothyroidism      no rx  . Heart murmur   . Aneurysm     aortic      Family History  Problem Relation Age of Onset  . Heart disease Father   . Breast cancer Mother      History   Social History  . Marital Status: Married    Spouse Name: N/A    Number of Children: 3  . Years of Education: N/A   Occupational History  . banker   .     Social History Main Topics  . Smoking status: Never Smoker   . Smokeless tobacco: Never Used  . Alcohol Use: No  . Drug Use: No  . Sexual Activity: Not on file   Other Topics Concern  . Not on file   Social History Narrative  . No narrative on file     Allergies  Allergen Reactions  . Crestor [Rosuvastatin Calcium]     myalgia  . Sulfonamide Derivatives      Outpatient Prescriptions Prior to Visit  Medication Sig Dispense Refill  . amLODipine-benazepril (LOTREL) 10-20 MG per capsule Take 1 capsule by mouth daily.        Marland Kitchen  cetirizine (ZYRTEC) 10 MG tablet Take 10 mg by mouth daily.       . cholecalciferol (VITAMIN D) 1000 UNITS tablet Take 1,000 Units by mouth. Take 2 tablets daily      . pravastatin (PRAVACHOL) 20 MG tablet Take 20 mg by mouth at bedtime.       . triamterene-hydrochlorothiazide (MAXZIDE-25) 37.5-25 MG per tablet Take 1 tablet by mouth daily.        Marland Kitchen azelastine (ASTELIN) 137 MCG/SPRAY nasal spray Place 1 spray into both nostrils daily as needed for allergies.        No facility-administered medications prior to visit.     Review of Systems  Constitutional: Negative for fever, chills, diaphoresis, activity change, appetite change, fatigue and unexpected weight change.  HENT: Negative for congestion, dental problem, ear discharge, ear pain, facial swelling, hearing loss, mouth sores, nosebleeds, postnasal drip, rhinorrhea, sinus pressure,  sneezing, sore throat, tinnitus, trouble swallowing and voice change.   Eyes: Negative for photophobia, discharge, itching and visual disturbance.  Respiratory: Negative for apnea, cough, choking, chest tightness, shortness of breath, wheezing and stridor.   Cardiovascular: Negative for chest pain, palpitations and leg swelling.  Gastrointestinal: Negative for nausea, vomiting, abdominal pain, constipation, blood in stool and abdominal distention.  Genitourinary: Negative for dysuria, urgency, frequency, hematuria, flank pain, decreased urine volume and difficulty urinating.  Musculoskeletal: Negative for arthralgias, back pain, gait problem, joint swelling, myalgias, neck pain and neck stiffness.  Skin: Negative for color change, pallor and rash.  Neurological: Negative for dizziness, tremors, seizures, syncope, speech difficulty, weakness, light-headedness, numbness and headaches.  Hematological: Negative for adenopathy. Does not bruise/bleed easily.  Psychiatric/Behavioral: Negative for confusion, sleep disturbance and agitation. The patient is not nervous/anxious.        Objective:   Physical Exam Filed Vitals:   12/13/13 1121  BP: 160/100  Pulse: 82  Height: 5\' 4"  (1.626 m)  Weight: 147 lb 12.8 oz (67.042 kg)  SpO2: 99%    Gen: Pleasant, well-nourished, in no distress,  normal affect  ENT: No lesions,  mouth clear,  oropharynx clear, no postnasal drip  Neck: No JVD, no TMG, no carotid bruits  Lungs: No use of accessory muscles, no dullness to percussion, clear without rales or rhonchi  Cardiovascular: RRR, heart sounds normal, no murmur or gallops, no peripheral edema  Abdomen: soft and NT, no HSM,  BS normal  Musculoskeletal: No deformities, no cyanosis or clubbing  Neuro: alert, non focal  Skin: Warm, no lesions or rashes     Assessment & Plan:   Pulmonary nodule, left Discussed results with her today. The bx are negative. She understands the potential for a  false negative. We agreed that she would like to wait and watch, repeat CT scan in June. Will f/u w Dr Delford Field at that time to look for interval change.     Updated Medication List Outpatient Encounter Prescriptions as of 12/13/2013  Medication Sig  . amLODipine-benazepril (LOTREL) 10-20 MG per capsule Take 1 capsule by mouth daily.    . cetirizine (ZYRTEC) 10 MG tablet Take 10 mg by mouth daily.   . cholecalciferol (VITAMIN D) 1000 UNITS tablet Take 1,000 Units by mouth. Take 2 tablets daily  . pravastatin (PRAVACHOL) 20 MG tablet Take 20 mg by mouth at bedtime.   . triamterene-hydrochlorothiazide (MAXZIDE-25) 37.5-25 MG per tablet Take 1 tablet by mouth daily.    Marland Kitchen azelastine (ASTELIN) 137 MCG/SPRAY nasal spray Place 1 spray into both nostrils daily as needed for  allergies.

## 2013-12-13 NOTE — Patient Instructions (Signed)
We will repeat your CT scan in June 2015 Follow with Dr Delford Field in June after the scan to review.  Call if you have any issues in the interim

## 2014-01-02 LAB — FUNGUS CULTURE W SMEAR
Fungal Smear: NONE SEEN
Fungal Smear: NONE SEEN

## 2014-01-17 LAB — AFB CULTURE WITH SMEAR (NOT AT ARMC)
Acid Fast Smear: NONE SEEN
Acid Fast Smear: NONE SEEN

## 2014-04-07 ENCOUNTER — Other Ambulatory Visit: Payer: Self-pay | Admitting: Orthopedic Surgery

## 2014-04-07 LAB — BODY FLUID CELL COUNT WITH DIFFERENTIAL
BASOS ABS: 0 %
EOS PCT: 0 %
LYMPHS PCT: 1 %
NEUTROS PCT: 91 %
NUCLEATED CELL COUNT: 2333 /mm3
OTHER CELLS BF: 0 %
OTHER MONONUCLEAR CELLS: 8 %

## 2014-04-07 LAB — SYNOVIAL FLUID, CRYSTAL

## 2014-04-11 LAB — BODY FLUID CULTURE

## 2014-04-21 ENCOUNTER — Ambulatory Visit: Payer: Self-pay | Admitting: Internal Medicine

## 2014-05-15 ENCOUNTER — Telehealth: Payer: Self-pay | Admitting: Emergency Medicine

## 2014-05-15 NOTE — Telephone Encounter (Signed)
Per OV 12/13/13 w/ RB: Patient Instructions       We will repeat your CT scan in June 2015 Follow with Dr Joya Gaskins in June after the scan to review.   Call if you have any issues in the interim  --  LMTCB x1

## 2014-05-16 NOTE — Telephone Encounter (Signed)
Spoke with the pt  She states she is aware that it is time to repeat her ct chest in June 2015, but does not want to proceed  She states she is doing well and does not want to pursue the nodule any further  Will call if she changes her mind  I will forward to RB to make him aware Nothing further needed per pt

## 2014-06-07 ENCOUNTER — Other Ambulatory Visit: Payer: Medicare Other

## 2015-03-20 ENCOUNTER — Ambulatory Visit: Payer: Self-pay | Admitting: Internal Medicine

## 2015-04-23 ENCOUNTER — Ambulatory Visit
Admit: 2015-04-23 | Disposition: A | Discharge status: Home / Self Care | Payer: Self-pay | Attending: Internal Medicine | Admitting: Internal Medicine

## 2015-11-14 ENCOUNTER — Other Ambulatory Visit: Payer: Self-pay | Admitting: Cardiology

## 2015-11-14 DIAGNOSIS — I712 Thoracic aortic aneurysm, without rupture, unspecified: Secondary | ICD-10-CM

## 2015-12-04 ENCOUNTER — Ambulatory Visit
Admission: RE | Admit: 2015-12-04 | Discharge: 2015-12-04 | Disposition: A | Discharge status: Home / Self Care | Payer: Medicare Other | Source: Ambulatory Visit | Attending: Cardiology | Admitting: Cardiology

## 2015-12-04 DIAGNOSIS — I712 Thoracic aortic aneurysm, without rupture, unspecified: Secondary | ICD-10-CM

## 2016-03-26 ENCOUNTER — Other Ambulatory Visit: Payer: Self-pay | Admitting: Internal Medicine

## 2016-03-26 DIAGNOSIS — Z1231 Encounter for screening mammogram for malignant neoplasm of breast: Secondary | ICD-10-CM

## 2016-04-23 ENCOUNTER — Other Ambulatory Visit: Payer: Self-pay | Admitting: Internal Medicine

## 2016-04-23 ENCOUNTER — Ambulatory Visit
Admission: RE | Admit: 2016-04-23 | Discharge: 2016-04-23 | Disposition: A | Discharge status: Home / Self Care | Payer: Medicare Other | Source: Ambulatory Visit | Attending: Internal Medicine | Admitting: Internal Medicine

## 2016-04-23 DIAGNOSIS — Z1231 Encounter for screening mammogram for malignant neoplasm of breast: Secondary | ICD-10-CM

## 2017-01-01 ENCOUNTER — Other Ambulatory Visit: Payer: Self-pay | Admitting: Cardiology

## 2017-01-01 DIAGNOSIS — I712 Thoracic aortic aneurysm, without rupture, unspecified: Secondary | ICD-10-CM

## 2017-01-03 HISTORY — PX: BLADDER SURGERY: SHX569

## 2017-01-28 ENCOUNTER — Ambulatory Visit
Admission: RE | Admit: 2017-01-28 | Discharge: 2017-01-28 | Disposition: A | Discharge status: Home / Self Care | Payer: Medicare Other | Source: Ambulatory Visit | Attending: Cardiology | Admitting: Cardiology

## 2017-01-28 DIAGNOSIS — I712 Thoracic aortic aneurysm, without rupture, unspecified: Secondary | ICD-10-CM

## 2017-02-02 ENCOUNTER — Telehealth: Payer: Self-pay | Admitting: Internal Medicine

## 2017-02-02 DIAGNOSIS — R911 Solitary pulmonary nodule: Secondary | ICD-10-CM

## 2017-02-02 NOTE — Telephone Encounter (Signed)
Katrina Hunter  76 year old [name] hs LLL mass > 1cm that is spiculated. New consult request by Dr [name]. CT was 01/28/17  Please  - get radiology to do super d of ct from 01/28/17  - do PET scan - do Pre-bd spiro and dlco only. No lung volume or bd response. No post-bd spiro  Finish all of above next few weeks and give 30 min slot to see me - can add 2 x 15ss on a day   Thanks  Dr. Brand Males, M.D., [facility].C.P Pulmonary and Critical Care Medicine Staff Physician [place] [facility] Pager: [phone], If no answer or between  15:00h - 7:00h: call [phone]  02/02/2017 9:06 AM

## 2017-02-03 NOTE — Telephone Encounter (Signed)
Ok to postpone all tests for a month but definitely do it then. You should still try to get the super-d to my [facility] - of the ct done in jan 2018  Dr. Brand Males, M.D., [facility].C.P Pulmonary and Critical Care Medicine Staff Physician [place] [facility] Pager: [phone], If no answer or between  15:00h - 7:00h: call [phone]  02/03/2017 1:36 PM

## 2017-02-03 NOTE — Telephone Encounter (Signed)
Called and spoke to pt. Informed her of the recs per MR. Pt verbalized understanding and understood the urgency of tests and OV but states she just had bladder surgery 4 weeks ago and is recovering and would like to postpone all tests and OV to one month out.   MR please advise. THanks.

## 2017-02-04 NOTE — Telephone Encounter (Signed)
Spoke with Katrina Hunter with [facility] and advised we need the ct chest done in Jan 2018 to be made super D and mailed to [place] attn MR

## 2017-02-04 NOTE — Telephone Encounter (Signed)
LMTCB for pt - she will need a CONSULT appt with MR after PET scan and PFT (around a month out per pt's request).Orders have been placed.  Called [facility] at [phone] and left message to get super D disc.

## 2017-02-05 ENCOUNTER — Telehealth: Payer: Self-pay | Admitting: Emergency Medicine

## 2017-02-05 NOTE — Telephone Encounter (Signed)
Patient is returning a call from [place], [place] is [phone] (home) and [phone] (cell).

## 2017-02-05 NOTE — Telephone Encounter (Signed)
Disc is now in my possession. This will be given to RB when he comes to the office for clinic on 02/11/17.

## 2017-02-05 NOTE — Telephone Encounter (Signed)
Spoke with pt. States that she wants to wait until after sees her bladder surgeon in April. She will call back to make these appointments.

## 2017-02-06 NOTE — Telephone Encounter (Signed)
Ok that is fine  Dr. Brand Males, M.D., [facility].C.P Pulmonary and Critical Care Medicine Staff Physician [place] [facility] Pager: [phone], If no answer or between  15:00h - 7:00h: call [phone]  02/06/2017 12:06 PM

## 2017-02-11 NOTE — Telephone Encounter (Signed)
Disc has been given to RB.

## 2017-03-04 ENCOUNTER — Encounter (HOSPITAL_COMMUNITY): Payer: Medicare Other

## 2017-03-23 ENCOUNTER — Other Ambulatory Visit: Payer: Self-pay | Admitting: Internal Medicine

## 2017-03-23 DIAGNOSIS — Z1231 Encounter for screening mammogram for malignant neoplasm of breast: Secondary | ICD-10-CM

## 2017-04-22 ENCOUNTER — Telehealth: Payer: Self-pay | Admitting: Internal Medicine

## 2017-04-22 ENCOUNTER — Ambulatory Visit
Admission: RE | Admit: 2017-04-22 | Discharge: 2017-04-22 | Disposition: A | Discharge status: Home / Self Care | Payer: Medicare Other | Source: Ambulatory Visit | Attending: Internal Medicine | Admitting: Internal Medicine

## 2017-04-22 DIAGNOSIS — I712 Thoracic aortic aneurysm, without rupture: Secondary | ICD-10-CM | POA: Diagnosis not present

## 2017-04-22 DIAGNOSIS — D259 Leiomyoma of uterus, unspecified: Secondary | ICD-10-CM | POA: Diagnosis not present

## 2017-04-22 DIAGNOSIS — R918 Other nonspecific abnormal finding of lung field: Secondary | ICD-10-CM | POA: Diagnosis present

## 2017-04-22 DIAGNOSIS — R911 Solitary pulmonary nodule: Secondary | ICD-10-CM | POA: Diagnosis not present

## 2017-04-22 DIAGNOSIS — K5730 Diverticulosis of large intestine without perforation or abscess without bleeding: Secondary | ICD-10-CM | POA: Diagnosis not present

## 2017-04-22 DIAGNOSIS — I700 Atherosclerosis of aorta: Secondary | ICD-10-CM | POA: Insufficient documentation

## 2017-04-22 HISTORY — DX: Solitary pulmonary nodule: R91.1

## 2017-04-22 LAB — GLUCOSE, CAPILLARY: Glucose-Capillary: 85 mg/dL (ref 65–99)

## 2017-04-22 MED ORDER — FLUDEOXYGLUCOSE F - 18 (FDG) INJECTION
12.0000 | Freq: Once | INTRAVENOUS | Status: AC | PRN
  Administered 2017-04-22: 12.53 via INTRAVENOUS

## 2017-04-22 NOTE — Telephone Encounter (Signed)
Triage  This was a referral from dr [name] and patient postponed consult. Looks like she went through and did PET scan. Former [facility] patient. And has seen byrum but all > 4 years ago. Pls give her new consult visit to see  prefer [facility] given possiblity PET scan shows low grade lung cancer  If delays with byrum can see me  Dr. Brand Males, M.D., [facility].C.P Pulmonary and Critical Care Medicine Staff Physician [place] [facility] Pager: [phone], If no answer or between  15:00h - 7:00h: call [phone]  04/22/2017 1:41 PM         Nm Pet Image Restag (ps) Skull Base To Thigh  Result Date: 04/22/2017 CLINICAL DATA:  Subsequent treatment strategy for enlarging left lower lobe pulmonary nodule. EXAM: NUCLEAR MEDICINE PET SKULL BASE TO THIGH TECHNIQUE: 12.5 mCi F-18 FDG was injected intravenously. Full-ring PET imaging was performed from the skull base to thigh after the radiotracer. CT data was obtained and used for attenuation correction and anatomic localization. FASTING BLOOD GLUCOSE:  Value: 85 mg/dl COMPARISON:  Multiple exams, including PET-CT from 09/04/2011 and CT chest from 01/28/2017 FINDINGS: NECK No hypermetabolic lymph nodes in the neck. CHEST In the region of the ill-defined spiculated 1.5 by 1.6 cm left lower lobe pulmonary nodule, maximum SUV is 1.7. No hypermetabolic adenopathy. Coronary, aortic arch, and branch vessel atherosclerotic vascular disease. Stable ascending aortic aneurysm, by my measurements 4.5 cm when measured perpendicular to the direction of the aorta. Faint accentuation of metabolic activity in the distal esophagus without CT correlate, likely physiologic. Pacer leads noted. ABDOMEN/PELVIS Physiologic activity in the bowel, notably the cecum and ascending colon. No definite CT correlate. Scattered sigmoid colon diverticula. Aortoiliac atherosclerotic vascular disease. Cholelithiasis. 3.9 cm subserosal uterine fibroid noted  eccentric to the right, without accentuated hypermetabolic activity compared to the myometrium. SKELETON No focal hypermetabolic activity to suggest skeletal metastasis. IMPRESSION: 1. The ill-defined 1.5 by 1.6 cm left lower lobe pulmonary nodule has a maximum SUV of only 1.7. Given the slow enlargement the natural history of this lesion over the last 6 years, I am suspicious for low-grade adenocarcinoma, which often does not have significantly elevated SUV. 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 2. Ascending thoracic aortic aneurysm is measured at 4.5 cm 1 performed transverse to the axis of the aorta. Ascending thoracic aortic aneurysm. Recommend semi-annual imaging followup by CTA or MRA and referral to cardiothoracic surgery if not already obtained. This recommendation follows 2010 ACCF/AHA/AATS/ACR/ASA/SCA/SCAI/SIR/STS/SVM Guidelines for the Diagnosis and Management of Patients With Thoracic Aortic Disease. Circulation. 2010; 121: J736-K815 3.  Aortic Atherosclerosis (ICD10-I70.0). 4. Other imaging findings of potential clinical significance: Cholelithiasis. Uterine fibroid. Scattered sigmoid colon diverticula. Electronically Signed   By: Van Clines M.D.   On: 04/22/2017 12:24

## 2017-04-22 NOTE — Telephone Encounter (Signed)
Called and spoke to pt. Informed her of the recs per MR. Pt verbalized understanding but states she would like to wait to get results from PET scan then schedule an appt.   MR please advise. Thanks.

## 2017-04-23 NOTE — Telephone Encounter (Signed)
Let her know that pet scan iss suggestive of a low grade slow growing lung cancer and that is why she needs to see Dr Lamonte Sakai  Dr. Brand Males, M.D., [facility].C.P Pulmonary and Critical Care Medicine Staff Physician [place] [facility] Pager: [phone], If no answer or between  15:00h - 7:00h: call [phone]  04/23/2017 3:06 PM

## 2017-04-27 ENCOUNTER — Ambulatory Visit: Payer: Medicare Other

## 2017-04-28 ENCOUNTER — Ambulatory Visit
Admission: RE | Admit: 2017-04-28 | Discharge: 2017-04-28 | Disposition: A | Discharge status: Home / Self Care | Payer: Medicare Other | Source: Ambulatory Visit | Attending: Internal Medicine | Admitting: Internal Medicine

## 2017-04-28 DIAGNOSIS — Z1231 Encounter for screening mammogram for malignant neoplasm of breast: Secondary | ICD-10-CM | POA: Insufficient documentation

## 2017-04-29 NOTE — Telephone Encounter (Signed)
RB's first available isnt until 06/26/17.   MR please advise. Thanks.

## 2017-05-01 NOTE — Telephone Encounter (Signed)
Patient called and is wanting additional information regarding the results of the PET Scan - she can be reached at [phone] -pr

## 2017-05-01 NOTE — Telephone Encounter (Signed)
Let her know she can see RB end June 2018 but if concerned she can make an interim visit to see me. LEt me know date of appt with RB  Dr. Brand Males, M.D., [facility].C.P Pulmonary and Critical Care Medicine Staff Physician [place] [facility] Pager: [phone], If no answer or between  15:00h - 7:00h: call [phone]  05/01/2017 8:13 AM

## 2017-05-01 NOTE — Telephone Encounter (Signed)
MR  Pt had some concerns and additional questions about her dx. Pt is requesting a phone call from you.

## 2017-05-01 NOTE — Telephone Encounter (Signed)
Pt has been scheduled for 06/02/17 with MR, as first available with RB was 06/26/17. Pt is aware of PET results and voiced her understanding. Nothing further needed.   Will route to MR to make him aware of apt.

## 2017-06-02 ENCOUNTER — Ambulatory Visit (INDEPENDENT_AMBULATORY_CARE_PROVIDER_SITE_OTHER): Payer: Medicare Other | Admitting: Internal Medicine

## 2017-06-02 ENCOUNTER — Encounter: Payer: Self-pay | Admitting: Internal Medicine

## 2017-06-02 DIAGNOSIS — R911 Solitary pulmonary nodule: Secondary | ICD-10-CM

## 2017-06-02 LAB — PULMONARY FUNCTION TEST
DL/VA % PRED: 92 %
DL/VA: 4.43 ml/min/mmHg/L
DLCO COR % PRED: 78 %
DLCO COR: 19.04 ml/min/mmHg
DLCO unc % pred: 73 %
DLCO unc: 17.83 ml/min/mmHg
FEF 25-75 POST: 2.67 L/s
FEF 25-75 Pre: 2.35 L/sec
FEF2575-%CHANGE-POST: 13 %
FEF2575-%PRED-POST: 169 %
FEF2575-%PRED-PRE: 148 %
FEV1-%Change-Post: 2 %
FEV1-%Pred-Post: 105 %
FEV1-%Pred-Pre: 103 %
FEV1-POST: 2.18 L
FEV1-Pre: 2.13 L
FEV1FVC-%CHANGE-POST: 0 %
FEV1FVC-%Pred-Pre: 110 %
FEV6-%CHANGE-POST: 3 %
FEV6-%Pred-Post: 100 %
FEV6-%Pred-Pre: 96 %
FEV6-PRE: 2.53 L
FEV6-Post: 2.61 L
FEV6FVC-%Pred-Post: 105 %
FEV6FVC-%Pred-Pre: 105 %
FVC-%Change-Post: 2 %
FVC-%PRED-POST: 95 %
FVC-%PRED-PRE: 92 %
FVC-POST: 2.61 L
FVC-PRE: 2.55 L
POST FEV1/FVC RATIO: 84 %
PRE FEV1/FVC RATIO: 83 %
Post FEV6/FVC ratio: 100 %
Pre FEV6/FVC Ratio: 100 %
RV % pred: 100 %
RV: 2.33 L
TLC % pred: 96 %
TLC: 4.88 L

## 2017-06-02 NOTE — Assessment & Plan Note (Signed)
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

## 2017-06-02 NOTE — Progress Notes (Signed)
Subjective:    Patient ID: Katrina Hunter, female    DOB: [birth year], 76 y.o.   MRN: [id number]  OV 06/02/2017   HPI   PCP [name], MD   IOV 06/02/2017  Chief Complaint  Patient presents with  . Pulmonary Consult    Pt is a self referreal for pulmonary nodule. Pt denies SOB, cough, CP/tightness, f/c/s.     Katrina Hunter with 1941/02/25 [address] [place] [address] has been referred here by Dr. [name] for left lower lobe slowly growing nodule. She presents with her husband. She is a lifelong nonsmoker. She is quite active and is asymptomatic from a respiratory standpoint. She is a retired Customer service manager and is a very good historian and is well aware of her health issues. Based on review of the chart and her own history she started having left lower lobe groundglass type nodule in 2012 which was slightly enlarging as of 2014. In December 2014 she underwent r navigation bronchoscopy by Dr. Baltazar Apo which was nondiagnostic [name] understands this is negative result]. Subsequently she saw Dr. [name] for regular care and had a CT scan of the chest shows further enlargement. Then in April 2018 she had a PET scan that shows the nodule to be 1.6 cm in size slowly enlarged since 2012. With a low uptake. Features are suggestive for bronchoalveolar cell carcinoma and there for she's been referred here. She again denies any problems  1 Results for Katrina Hunter, Katrina Hunter (MRN [id number]) as of 06/02/2017 11:36  Ref. Range 08/12/2011 07:34 09/03/2013 23:19 12/02/2013 13:39 04/07/2014 12:30  Creatinine Latest Ref Range: 0.50 - 1.10 mg/dL 0.9  0.98    Results for Katrina Hunter, Katrina Hunter (MRN [id number]) as of 06/02/2017 11:36  Ref. Range 08/12/2011 07:34 09/03/2013 23:19 12/02/2013 13:39 04/07/2014 12:30  Hemoglobin Latest Ref Range: 12.0 - 15.0 g/dL 12.5  13.3     has a past medical history of Aneurysm ([place]); AV block, complete ([place]); Heart murmur; HLD (hyperlipidemia); HTN (hypertension); Hypothyroidism; Pacemaker  (12); and Pulmonary nodule, left (04/22/2017).   reports that she has never smoked. She has never used smokeless tobacco.  Past Surgical History:  Procedure Laterality Date  . BLADDER SURGERY  01/03/2017  . PACEMAKER INSERTION  07/2011   [name]  . TONSILLECTOMY  age 67  . VIDEO BRONCHOSCOPY WITH ENDOBRONCHIAL NAVIGATION N/A 12/05/2013   Procedure: VIDEO BRONCHOSCOPY WITH ENDOBRONCHIAL NAVIGATION;  Surgeon: Collene Gobble, MD;  Location: [facility] OR;  Service: Thoracic;  Laterality: N/A;    Allergies  Allergen Reactions  . Crestor [Rosuvastatin Calcium]     myalgia  . Sulfonamide Derivatives     Immunization History  Administered Date(s) Administered  . Influenza Split 09/28/2013  . Influenza Whole 12/05/2011  . Influenza-Unspecified 11/27/2016  . Pneumococcal Conjugate-13 03/23/2017, 03/30/2017  . Pneumococcal-Unspecified 12/29/2010    Family History  Problem Relation Age of Onset  . Heart disease Father   . Breast cancer Mother 81     Current Outpatient Prescriptions:  .  amLODipine-benazepril (LOTREL) 10-20 MG per capsule, Take 1 capsule by mouth daily.  , Disp: , Rfl:  .  azelastine (ASTELIN) 137 MCG/SPRAY nasal spray, Place 1 spray into both nostrils daily as needed for allergies. , Disp: , Rfl:  .  cetirizine (ZYRTEC) 10 MG tablet, Take 10 mg by mouth daily. , Disp: , Rfl:  .  cholecalciferol (VITAMIN D) 1000 UNITS tablet, Take 1,000 Units by mouth. Take 2 tablets daily, Disp: , Rfl:  .  pravastatin (PRAVACHOL) 20 MG tablet, Take 20 mg by mouth at bedtime. , Disp: , Rfl:  .  triamterene-hydrochlorothiazide (MAXZIDE-25) 37.5-25 MG per tablet, Take 1 tablet by mouth daily.  , Disp: , Rfl:      Review of Systems  Constitutional: Negative for fever and unexpected weight change.  HENT: Negative for congestion, dental problem, ear pain, nosebleeds, postnasal drip, rhinorrhea, sinus pressure, sneezing, sore throat and trouble swallowing.   Eyes: Negative for redness and  itching.  Respiratory: Negative for cough, chest tightness, shortness of breath and wheezing.   Cardiovascular: Negative for palpitations and leg swelling.  Gastrointestinal: Negative for nausea and vomiting.  Genitourinary: Negative for dysuria.  Musculoskeletal: Negative for joint swelling.  Skin: Negative for rash.  Neurological: Negative for headaches.  Hematological: Does not bruise/bleed easily.  Psychiatric/Behavioral: Negative for dysphoric mood. The patient is not nervous/anxious.        Objective:   Physical Exam  Constitutional: She is oriented to person, place, and time. She appears well-developed and well-nourished. No distress.  HENT:  Head: Normocephalic and atraumatic.  Right Ear: External ear normal.  Left Ear: External ear normal.  Mouth/Throat: Oropharynx is clear and moist. No oropharyngeal exudate.  Eyes: Conjunctivae and EOM are normal. Pupils are equal, round, and reactive to light. Right eye exhibits no discharge. Left eye exhibits no discharge. No scleral icterus.  Neck: Normal range of motion. Neck supple. No JVD present. No tracheal deviation present. No thyromegaly present.  Cardiovascular: Normal rate, regular rhythm, normal heart sounds and intact distal pulses.  Exam reveals no gallop and no friction rub.   No murmur heard. Pulmonary/Chest: Effort normal and breath sounds normal. No respiratory distress. She has no wheezes. She has no rales. She exhibits no tenderness.  Abdominal: Soft. Bowel sounds are normal. She exhibits no distension and no mass. There is no tenderness. There is no rebound and no guarding.  Musculoskeletal: Normal range of motion. She exhibits no edema or tenderness.  Lymphadenopathy:    She has no cervical adenopathy.  Neurological: She is alert and oriented to person, place, and time. She has normal reflexes. No cranial nerve deficit. She exhibits normal muscle tone. Coordination normal.  Skin: Skin is warm and dry. No rash noted.  She is not diaphoretic. No erythema. No pallor.  Psychiatric: She has a normal mood and affect. Her behavior is normal. Judgment and thought content normal.  Vitals reviewed.   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 Brand Males, M.D., [facility].C.P Pulmonary and Critical Care Medicine Staff Physician [place] [facility] Pager: [phone], If no answer or between  15:00h - 7:00h: call [phone]  06/02/2017 11:40 AM

## 2017-06-02 NOTE — Patient Instructions (Signed)
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

## 2017-06-02 NOTE — Progress Notes (Signed)
PFT completed today.Katie Welchel,CMA  

## 2017-06-04 NOTE — Telephone Encounter (Signed)
I met face to face patint  Dr. Brand Males, M.D., [facility].C.P Pulmonary and Critical Care Medicine Staff Physician [place] [facility] Pager: [phone], If no answer or between  15:00h - 7:00h: call [phone]  06/04/2017 2:37 AM

## 2017-06-10 ENCOUNTER — Encounter: Payer: Medicare Other | Admitting: Thoracic Surgery (Cardiothoracic Vascular Surgery)

## 2017-06-12 ENCOUNTER — Encounter: Payer: Self-pay | Admitting: Thoracic Surgery (Cardiothoracic Vascular Surgery)

## 2017-06-12 ENCOUNTER — Institutional Professional Consult (permissible substitution) (INDEPENDENT_AMBULATORY_CARE_PROVIDER_SITE_OTHER): Payer: Medicare Other | Admitting: Thoracic Surgery (Cardiothoracic Vascular Surgery)

## 2017-06-12 VITALS — BP 138/82 | HR 94 | Resp 20 | Ht 64.0 in | Wt 145.0 lb

## 2017-06-12 DIAGNOSIS — I7121 Aneurysm of the ascending aorta, without rupture: Secondary | ICD-10-CM

## 2017-06-12 DIAGNOSIS — I712 Thoracic aortic aneurysm, without rupture: Secondary | ICD-10-CM

## 2017-06-12 DIAGNOSIS — R911 Solitary pulmonary nodule: Secondary | ICD-10-CM

## 2017-06-12 NOTE — Progress Notes (Addendum)
PCP is [name], MD Referring Provider is Brand Males, MD  Chief Complaint  Patient presents with  . 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 Katrina Hunter was found to be profoundly bradycardic prior to colonoscopy back in 2012. Katrina Hunter ended up needing a permanent pacemaker. During Katrina Hunter preoperative evaluation and chest x-ray showed a lung nodule. CT showed a 1 cm groundglass opacity and Katrina Hunter was followed. The groundglass opacity grew and in 2014 Katrina Hunter had a navigational bronchoscopy which was negative. Continued follow-up showed progressive slow growth of the nodule. In January the nodule is up to 1.6 cm in size. A PET CT was done in April which showed the nodule was mildly hypermetabolic with an SUV of 1.7.  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  Katrina Hunter denies cough, ingestion, shortness of breath, chest pain, pressure, or tightness, change in appetite, weight loss, headaches or visual changes, new bone or joint pain. Zubrod Score: At the time of surgery this patient's most appropriate activity status/level should be described as: [x]     0    Normal activity, no symptoms []     1    Restricted in physical strenuous activity but ambulatory, able to do out light work []     2    Ambulatory and capable of self care, unable to do work activities, up and about >50 % of waking hours                              []     3    Only limited self care, in bed greater than 50% of waking hours []     4    Completely disabled, no self care, confined to bed or chair []     5    Moribund   Past Medical History:  Diagnosis Date  . Aneurysm ([place])    aortic   . AV block, complete ([place])    PPM placed 07/2011 [facility]  . Heart murmur   . HLD (hyperlipidemia)   . HTN (hypertension)   .  Hypothyroidism     no rx  . Pacemaker 12  . Pulmonary nodule, left 04/22/2017   From order notes    Past Surgical History:  Procedure Laterality Date  . BLADDER SURGERY  01/03/2017  . PACEMAKER INSERTION  07/2011   [name]  . TONSILLECTOMY  age 80  . VIDEO BRONCHOSCOPY WITH ENDOBRONCHIAL NAVIGATION N/A 12/05/2013   Procedure: VIDEO BRONCHOSCOPY WITH ENDOBRONCHIAL NAVIGATION;  Surgeon: Collene Gobble, MD;  Location: [facility] OR;  Service: Thoracic;  Laterality: N/A;    Family History  Problem Relation Age of Onset  . Heart disease Father   . Breast cancer Mother 73    Social History Social History  Substance Use Topics  . Smoking status: Never Smoker  . Smokeless tobacco: Never Used  . Alcohol use No    Current Outpatient Prescriptions  Medication Sig Dispense Refill  . amLODipine-benazepril (LOTREL) 10-20 MG per capsule Take 1 capsule by mouth daily.      Marland Kitchen azelastine (ASTELIN) 137 MCG/SPRAY nasal spray Place 1 spray into both nostrils daily as needed for allergies.     . cetirizine (ZYRTEC)  10 MG tablet Take 10 mg by mouth daily.     . cholecalciferol (VITAMIN D) 1000 UNITS tablet Take 1,000 Units by mouth. Take 2 tablets daily    . pravastatin (PRAVACHOL) 20 MG tablet Take 20 mg by mouth at bedtime.     . triamterene-hydrochlorothiazide (MAXZIDE-25) 37.5-25 MG per tablet Take 1 tablet by mouth daily.       No current facility-administered medications for this visit.     Allergies  Allergen Reactions  . Crestor [Rosuvastatin Calcium]     myalgia  . Sulfonamide Derivatives Rash    Review of Systems  Constitutional: Positive for fatigue (slowly improving after surgery in January). Negative for activity change, chills, fever and unexpected weight change.  HENT: Negative for trouble swallowing and voice change.   Eyes: Negative for visual disturbance.  Respiratory: Negative for cough, shortness of breath and wheezing.   Cardiovascular: Negative for chest pain, palpitations  and leg swelling.  Gastrointestinal: Negative for abdominal pain and blood in stool.  Genitourinary: Negative for difficulty urinating and dysuria.  Musculoskeletal: Negative for arthralgias and myalgias.  Neurological: Negative for dizziness, seizures and syncope.  Hematological: Negative for adenopathy. Does not bruise/bleed easily.  All other systems reviewed and are negative.   BP 138/82   Pulse 94   Resp 20   Ht 5\' 4"  (1.626 m)   Wt 145 lb (65.8 kg)   SpO2 97% Comment: RA  BMI 24.89 kg/m  Physical Exam  Constitutional: Katrina Hunter is oriented to person, place, and time. Katrina Hunter appears well-developed and well-nourished. No distress.  HENT:  Head: Normocephalic and atraumatic.  Eyes: Conjunctivae and EOM are normal. No scleral icterus.  Neck: Neck supple. No thyromegaly present.  Cardiovascular: Normal rate, regular rhythm and normal heart sounds.  Exam reveals no gallop and no friction rub.   No murmur heard. Pulmonary/Chest: Effort normal and breath sounds normal. No respiratory distress. Katrina Hunter has no wheezes. Katrina Hunter has no rales.  Abdominal: Katrina Hunter exhibits no distension. There is no tenderness.  Musculoskeletal: Normal range of motion. Katrina Hunter exhibits no edema.  Lymphadenopathy:    Katrina Hunter has no cervical adenopathy.  Neurological: Katrina Hunter is alert and oriented to person, place, and time. No cranial nerve deficit. Katrina Hunter exhibits normal muscle tone.  Vitals reviewed.    Diagnostic Tests: NUCLEAR MEDICINE PET SKULL BASE TO THIGH  TECHNIQUE: 12.5 mCi F-18 FDG was injected intravenously. Full-ring PET imaging was performed from the skull base to thigh after the radiotracer. CT data was obtained and used for attenuation correction and anatomic localization.  FASTING BLOOD GLUCOSE:  Value: 85 mg/dl  COMPARISON:  Multiple exams, including PET-CT from 09/04/2011 and CT chest from 01/28/2017  FINDINGS: NECK  No hypermetabolic lymph nodes in the neck.  CHEST  In the region of the  ill-defined spiculated 1.5 by 1.6 cm left lower lobe pulmonary nodule, maximum SUV is 1.7.  No hypermetabolic adenopathy. Coronary, aortic arch, and branch vessel atherosclerotic vascular disease. Stable ascending aortic aneurysm, by my measurements 4.5 cm when measured perpendicular to the direction of the aorta.  Faint accentuation of metabolic activity in the distal esophagus without CT correlate, likely physiologic. Pacer leads noted.  ABDOMEN/PELVIS  Physiologic activity in the bowel, notably the cecum and ascending colon. No definite CT correlate.  Scattered sigmoid colon diverticula. Aortoiliac atherosclerotic vascular disease.  Cholelithiasis. 3.9 cm subserosal uterine fibroid noted eccentric to the right, without accentuated hypermetabolic activity compared to the myometrium.  SKELETON  No focal hypermetabolic activity to suggest skeletal metastasis.  IMPRESSION: 1. The ill-defined 1.5 by 1.6 cm left lower lobe pulmonary nodule has a maximum SUV of only 1.7. Given the slow enlargement the natural history of this lesion over the last 6 years, I am suspicious for low-grade adenocarcinoma, which often does not have significantly elevated SUV. 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 2. Ascending thoracic aortic aneurysm is measured at 4.5 cm 1 performed transverse to the axis of the aorta. Ascending thoracic aortic aneurysm. Recommend semi-annual imaging followup by CTA or MRA and referral to cardiothoracic surgery if not already obtained. This recommendation follows 2010 ACCF/AHA/AATS/ACR/ASA/SCA/SCAI/SIR/STS/SVM Guidelines for the Diagnosis and Management of Patients With Thoracic Aortic Disease. Circulation. 2010; 121: I967-E938 3.  Aortic Atherosclerosis (ICD10-I70.0). 4. Other imaging findings of potential clinical significance: Cholelithiasis. Uterine fibroid.  Scattered sigmoid colon diverticula.   Electronically Signed   By: Van Clines M.D.   On: 04/22/2017 12:24  I personally reviewed the PET CT as well as Katrina Hunter previous CTs and concur with the findings noted above.  Impression: 76 year old woman with a 1.6 x 1.5 cm left lower lobe nodule that has both solid and sub- solid components with low-grade activity on PET/CT. 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 Other potential causes would be a indolent infection or an very unusual inflammatory nodule. Regardless given that the nodule is growing would need to be considered a low-grade adenocarcinoma was to be proven otherwise.  I discussed several different options with Katrina Hunter and Katrina Hunter husband. One would be continued radiographic follow-up. I did not recommend that. Second option would be navigational bronchoscopy for diagnostic purposes. Katrina Hunter understands just because it was negative before does not mean it will be negative the second time. That would be the preferred option if Katrina Hunter was planning to treat with radiation. The third option would be to proceed with surgical resection. The nodule is central in the lower lobe and relatively deep so it would be difficult to resect with an adequate margin with just a wedge resection. I do think a segmentectomy would be an option. We discussed the advantages and disadvantages of each of those approaches. Katrina Hunter is very likely reluctant to have surgery, but Katrina Hunter husband strongly favors resection.  I described the proposed operation to Katrina Hunter. It would be a left VATS and wedge resection or segmentectomy. If positive on frozen section we will remove lymph nodes as well. I informed Katrina Hunter of the general nature of the operation including the need for general anesthesia, the incisions to be used, the use of the drainage tube postoperatively, the expected hospital stay, and the overall recovery. I informed Katrina Hunter of the indications,  risks, benefits, and alternatives. Katrina Hunter understands the risks include, but are not limited to death, MI, DVT, PE, bleeding, possible need for transfusion, infection, prolonged air leak, cardiac arrhythmias, as well as possibility of other unforeseeable complications.  After a long discussion Katrina Hunter wishes to proceed with left VATS for resection.  Ascending aneurysm- the aneurysm is unchanged although there is a wide variety of measurements given by radiology ranging from 4.5 cm on the recent PET/CT to 5.1 cm on the CT from January. I suspect 5.1 similar measurement comes in an area where there is significant curvature the aorta and that accounts for the difference. There is no indication for surgery at the present time but Katrina Hunter does need continued follow-up of that every 6 months.  Plan:  Left VATS for wedge resection versus segmentectomy for left lower lobe nodule on Monday, 06/29/2017  Melrose Nakayama, MD [facility] [phone]

## 2017-06-15 ENCOUNTER — Other Ambulatory Visit: Payer: Self-pay | Admitting: *Deleted

## 2017-06-15 DIAGNOSIS — R911 Solitary pulmonary nodule: Secondary | ICD-10-CM

## 2017-06-25 ENCOUNTER — Other Ambulatory Visit: Payer: Self-pay

## 2017-06-25 ENCOUNTER — Encounter (HOSPITAL_COMMUNITY)
Admission: RE | Admit: 2017-06-25 | Discharge: 2017-06-25 | Disposition: A | Discharge status: Home / Self Care | Payer: Medicare Other | Source: Ambulatory Visit | Attending: Thoracic Surgery (Cardiothoracic Vascular Surgery) | Admitting: Thoracic Surgery (Cardiothoracic Vascular Surgery)

## 2017-06-25 ENCOUNTER — Encounter (HOSPITAL_COMMUNITY): Payer: Self-pay

## 2017-06-25 DIAGNOSIS — R911 Solitary pulmonary nodule: Secondary | ICD-10-CM | POA: Diagnosis not present

## 2017-06-25 DIAGNOSIS — Z01812 Encounter for preprocedural laboratory examination: Secondary | ICD-10-CM | POA: Diagnosis not present

## 2017-06-25 HISTORY — DX: Headache: R51

## 2017-06-25 HISTORY — DX: Headache, unspecified: R51.9

## 2017-06-25 HISTORY — DX: Personal history of other diseases of urinary system: Z87.448

## 2017-06-25 LAB — BLOOD GAS, ARTERIAL
ACID-BASE DEFICIT: 0.1 mmol/L (ref 0.0–2.0)
BICARBONATE: 22.7 mmol/L (ref 20.0–28.0)
DRAWN BY: 421801
FIO2: 21
O2 SAT: 90.4 %
PATIENT TEMPERATURE: 98.6
pCO2 arterial: 28.9 mmHg — ABNORMAL LOW (ref 32.0–48.0)
pH, Arterial: 7.507 — ABNORMAL HIGH (ref 7.350–7.450)
pO2, Arterial: 53.7 mmHg — ABNORMAL LOW (ref 83.0–108.0)

## 2017-06-25 LAB — CBC
HCT: 41 % (ref 36.0–46.0)
Hemoglobin: 13.4 g/dL (ref 12.0–15.0)
MCH: 28.3 pg (ref 26.0–34.0)
MCHC: 32.7 g/dL (ref 30.0–36.0)
MCV: 86.5 fL (ref 78.0–100.0)
PLATELETS: 277 10*3/uL (ref 150–400)
RBC: 4.74 MIL/uL (ref 3.87–5.11)
RDW: 14.1 % (ref 11.5–15.5)
WBC: 4.9 10*3/uL (ref 4.0–10.5)

## 2017-06-25 LAB — URINALYSIS, ROUTINE W REFLEX MICROSCOPIC
Bilirubin Urine: NEGATIVE
Glucose, UA: NEGATIVE mg/dL
Hgb urine dipstick: NEGATIVE
Ketones, ur: NEGATIVE mg/dL
Leukocytes, UA: NEGATIVE
NITRITE: NEGATIVE
PH: 7 (ref 5.0–8.0)
Protein, ur: NEGATIVE mg/dL
SPECIFIC GRAVITY, URINE: 1.004 — AB (ref 1.005–1.030)

## 2017-06-25 LAB — COMPREHENSIVE METABOLIC PANEL
ALBUMIN: 4.3 g/dL (ref 3.5–5.0)
ALT: 12 U/L — ABNORMAL LOW (ref 14–54)
AST: 23 U/L (ref 15–41)
Alkaline Phosphatase: 107 U/L (ref 38–126)
Anion gap: 6 (ref 5–15)
BUN: 15 mg/dL (ref 6–20)
CHLORIDE: 104 mmol/L (ref 101–111)
CO2: 28 mmol/L (ref 22–32)
Calcium: 9.9 mg/dL (ref 8.9–10.3)
Creatinine, Ser: 1.09 mg/dL — ABNORMAL HIGH (ref 0.44–1.00)
GFR calc Af Amer: 56 mL/min — ABNORMAL LOW (ref >60)
GFR calc non Af Amer: 48 mL/min — ABNORMAL LOW (ref >60)
Glucose, Bld: 98 mg/dL (ref 65–99)
POTASSIUM: 3.6 mmol/L (ref 3.5–5.1)
Sodium: 138 mmol/L (ref 135–145)
Total Bilirubin: 1 mg/dL (ref 0.3–1.2)
Total Protein: 8 g/dL (ref 6.5–8.1)

## 2017-06-25 LAB — SURGICAL PCR SCREEN
MRSA, PCR: NEGATIVE
STAPHYLOCOCCUS AUREUS: NEGATIVE

## 2017-06-25 LAB — TYPE AND SCREEN
ABO/RH(D): O POS
ANTIBODY SCREEN: NEGATIVE

## 2017-06-25 LAB — ABO/RH: ABO/RH(D): O POS

## 2017-06-25 LAB — APTT: APTT: 30 s (ref 24–36)

## 2017-06-25 LAB — PROTIME-INR
INR: 0.96
PROTHROMBIN TIME: 12.8 s (ref 11.4–15.2)

## 2017-06-25 NOTE — Pre-Procedure Instructions (Signed)
    Katrina Hunter  06/25/2017      [facility] - [place], [address] [address] Phone: [phone] Fax: [phone]    Your procedure is scheduled on Monday, June 29, 2017  Report to [facility] Admitting at 5:30 A.M.  Call this number if you have problems the morning of surgery:  [phone]   Remember:  Do not eat food or drink liquids after midnight Sunday, June 28, 2017  Take these medicines the morning of surgery with A SIP OF WATER : if needed: cetirizine (ZYRTEC) for allergies, azelastine (ASTELIN)  nasal spray for allergies  Stop taking Aspirin, vitamins, fish oil and herbal medications. Do not take any NSAIDs ie: Ibuprofen, Motrin, Advil, Naproxen ( Anaprox.Aleve), BC and Goody Powder or any medication containing Aspirin; stop now.  Do not wear jewelry, make-up or nail polish.  Do not wear lotions, powders, or perfumes, or deoderant.  Do not shave 48 hours prior to surgery.   Do not bring valuables to the hospital.  [facility] is not responsible for any belongings or valuables.  Contacts, dentures or bridgework may not be worn into surgery.  Leave your suitcase in the car.  After surgery it may be brought to your room. For patients admitted to the hospital, discharge time will be determined by your treatment team. Special instructions: Shower the night before surgery and the morning of surgery with CHG.  Please read over the following fact sheets that you were given. Pain Booklet, Coughing and Deep Breathing, Blood Transfusion Information, MRSA Information and Surgical Site Infection Prevention

## 2017-06-25 NOTE — Progress Notes (Signed)
Pt denies SOB and chest pain but is under the care of Dr. [name], Cardiology. Pt denies having a cardiac cath but stated that an echo was performed by Dr. [name]; records requested. Pt denies having an EKG within the last month.

## 2017-06-26 ENCOUNTER — Institutional Professional Consult (permissible substitution): Payer: Medicare Other | Admitting: Emergency Medicine

## 2017-06-26 NOTE — Progress Notes (Signed)
Anesthesia Chart Review:  Pt is a 76 year old female scheduled for video assisted thoracoscopy, L chest wedge resection, possible segmentectomy on 06/29/2017 with Modesto Charon, MD  - PCP is Emily Filbert, MD (notes in care everywhere)  - Cardiologist is Tollie Eth, MD - Pulmonologist is Brand Males, MD  PMH includes:  Complete heart block, pacemaker ([employer], inserted 08/14/11), thoracic aortic aneurysm (stable by 01/28/17 CT), HTN, hyperlipidemia.  Never smoker. BMI 25  Medications include: amlodipine-benazepril, pravastatin, triamterene-hctz.   Preoperative labs reviewed.    CXR will be obtained DOS  CT chest 01/28/17:  - Stable 5.1 cm ascending thoracic aortic aneurysm.  - 16 mm spiculated pulmonary nodule in left lower lobe, mildly increased in size compared to previous studies. This is highly suspicious for bronchogenic adenocarcinoma. Consider PET-CT scan and/or percutaneous needle biopsy. - No evidence of lymphadenopathy or pleural effusion.  EKG 06/25/17: AV dual-paced rhythm  Echo (in the name Aimy Sweeting) 07/30/11 (Dr. Thurman Coyer office):  1. Normal LV function with EF 65%. 2. Mildly dilated aorta.  Perioperative prescription form for pacemaker pending from Dr. [name].   If no changes, I anticipate pt can proceed with surgery as scheduled.   Willeen Cass, FNP-BC [facility]An[facility]hesiology Phone: [phone] 06/26/2017 2:43 PM

## 2017-06-29 ENCOUNTER — Inpatient Hospital Stay (HOSPITAL_COMMUNITY): Payer: Medicare Other | Admitting: Emergency Medicine

## 2017-06-29 ENCOUNTER — Encounter (HOSPITAL_COMMUNITY): Payer: Self-pay | Admitting: *Deleted

## 2017-06-29 ENCOUNTER — Encounter (HOSPITAL_COMMUNITY)
Admission: RE | Disposition: A | Discharge status: Home / Self Care | Payer: Self-pay | Source: Ambulatory Visit | Attending: Thoracic Surgery (Cardiothoracic Vascular Surgery)

## 2017-06-29 ENCOUNTER — Inpatient Hospital Stay (HOSPITAL_COMMUNITY): Payer: Medicare Other

## 2017-06-29 ENCOUNTER — Inpatient Hospital Stay (HOSPITAL_COMMUNITY): Payer: Medicare Other | Admitting: Certified Registered"

## 2017-06-29 ENCOUNTER — Inpatient Hospital Stay (HOSPITAL_COMMUNITY)
Admission: RE | Admit: 2017-06-29 | Discharge: 2017-07-02 | DRG: 164 | Disposition: A | Discharge status: Home / Self Care | Payer: Medicare Other | Source: Ambulatory Visit | Attending: Thoracic Surgery (Cardiothoracic Vascular Surgery) | Admitting: Thoracic Surgery (Cardiothoracic Vascular Surgery)

## 2017-06-29 DIAGNOSIS — E785 Hyperlipidemia, unspecified: Secondary | ICD-10-CM | POA: Diagnosis present

## 2017-06-29 DIAGNOSIS — J9811 Atelectasis: Secondary | ICD-10-CM | POA: Diagnosis present

## 2017-06-29 DIAGNOSIS — R911 Solitary pulmonary nodule: Secondary | ICD-10-CM

## 2017-06-29 DIAGNOSIS — Z79899 Other long term (current) drug therapy: Secondary | ICD-10-CM | POA: Diagnosis not present

## 2017-06-29 DIAGNOSIS — I712 Thoracic aortic aneurysm, without rupture: Secondary | ICD-10-CM | POA: Diagnosis present

## 2017-06-29 DIAGNOSIS — Z9689 Presence of other specified functional implants: Secondary | ICD-10-CM

## 2017-06-29 DIAGNOSIS — I10 Essential (primary) hypertension: Secondary | ICD-10-CM | POA: Diagnosis present

## 2017-06-29 DIAGNOSIS — Z888 Allergy status to other drugs, medicaments and biological substances status: Secondary | ICD-10-CM

## 2017-06-29 DIAGNOSIS — E876 Hypokalemia: Secondary | ICD-10-CM | POA: Diagnosis present

## 2017-06-29 DIAGNOSIS — Z09 Encounter for follow-up examination after completed treatment for conditions other than malignant neoplasm: Secondary | ICD-10-CM

## 2017-06-29 DIAGNOSIS — C3490 Malignant neoplasm of unspecified part of unspecified bronchus or lung: Secondary | ICD-10-CM | POA: Diagnosis present

## 2017-06-29 DIAGNOSIS — E039 Hypothyroidism, unspecified: Secondary | ICD-10-CM | POA: Diagnosis present

## 2017-06-29 DIAGNOSIS — Z902 Acquired absence of lung [part of]: Secondary | ICD-10-CM

## 2017-06-29 DIAGNOSIS — C3432 Malignant neoplasm of lower lobe, left bronchus or lung: Secondary | ICD-10-CM | POA: Diagnosis not present

## 2017-06-29 DIAGNOSIS — J939 Pneumothorax, unspecified: Secondary | ICD-10-CM

## 2017-06-29 HISTORY — PX: LYMPH NODE DISSECTION: SHX5087

## 2017-06-29 HISTORY — PX: VIDEO ASSISTED THORACOSCOPY (VATS)/WEDGE RESECTION: SHX6174

## 2017-06-29 SURGERY — VIDEO ASSISTED THORACOSCOPY (VATS)/WEDGE RESECTION
Anesthesia: General | Site: Chest | Laterality: Left

## 2017-06-29 MED ORDER — HYDROMORPHONE HCL 1 MG/ML IJ SOLN
INTRAMUSCULAR | Status: AC
  Filled 2017-06-29: qty 0.5

## 2017-06-29 MED ORDER — DEXTROSE 5 % IV SOLN
1.5000 g | Freq: Two times a day (BID) | INTRAVENOUS | Status: AC
  Administered 2017-06-29 – 2017-06-30 (×2): 1.5 g via INTRAVENOUS
  Filled 2017-06-29 (×2): qty 1.5

## 2017-06-29 MED ORDER — OXYCODONE HCL 5 MG PO TABS: 5.0000 mg | ORAL_TABLET | Freq: Once | ORAL | Status: DC | PRN

## 2017-06-29 MED ORDER — TRAMADOL HCL 50 MG PO TABS
50.0000 mg | ORAL_TABLET | Freq: Four times a day (QID) | ORAL | Status: DC | PRN
  Administered 2017-06-30 – 2017-07-01 (×3): 50 mg via ORAL
  Filled 2017-06-29 (×3): qty 1

## 2017-06-29 MED ORDER — OXYCODONE HCL 5 MG/5ML PO SOLN: 5.0000 mg | Freq: Once | ORAL | Status: DC | PRN

## 2017-06-29 MED ORDER — DIPHENHYDRAMINE HCL 50 MG/ML IJ SOLN: 12.5000 mg | Freq: Four times a day (QID) | INTRAMUSCULAR | Status: DC | PRN

## 2017-06-29 MED ORDER — PROPOFOL 10 MG/ML IV BOLUS
INTRAVENOUS | Status: AC
  Filled 2017-06-29: qty 20

## 2017-06-29 MED ORDER — POTASSIUM CHLORIDE 10 MEQ/50ML IV SOLN: 10.0000 meq | Freq: Every day | INTRAVENOUS | Status: DC | PRN

## 2017-06-29 MED ORDER — ONDANSETRON HCL 4 MG/2ML IJ SOLN
INTRAMUSCULAR | Status: AC
  Filled 2017-06-29: qty 2

## 2017-06-29 MED ORDER — BUPIVACAINE 0.5 % ON-Q PUMP SINGLE CATH 400 ML
400.0000 mL | INJECTION | Status: DC
  Filled 2017-06-29: qty 400

## 2017-06-29 MED ORDER — AMLODIPINE BESYLATE 10 MG PO TABS
10.0000 mg | ORAL_TABLET | Freq: Every day | ORAL | Status: DC
  Administered 2017-06-30 – 2017-07-02 (×3): 10 mg via ORAL
  Filled 2017-06-29 (×3): qty 1

## 2017-06-29 MED ORDER — PHENYLEPHRINE HCL 10 MG/ML IJ SOLN
INTRAMUSCULAR | Status: AC
  Filled 2017-06-29: qty 1

## 2017-06-29 MED ORDER — FENTANYL 40 MCG/ML IV SOLN
INTRAVENOUS | Status: DC
  Administered 2017-06-29: 40 ug via INTRAVENOUS
  Administered 2017-06-29: 90 ug via INTRAVENOUS
  Administered 2017-06-29: 1000 ug via INTRAVENOUS
  Administered 2017-06-30: 50 ug via INTRAVENOUS
  Administered 2017-06-30: 100 ug via INTRAVENOUS
  Administered 2017-06-30: 20 ug via INTRAVENOUS
  Administered 2017-06-30: 30 ug via INTRAVENOUS
  Administered 2017-06-30: 10 ug via INTRAVENOUS
  Administered 2017-07-01 (×3): 0 ug via INTRAVENOUS
  Filled 2017-06-29 (×2): qty 25

## 2017-06-29 MED ORDER — SUCCINYLCHOLINE CHLORIDE 20 MG/ML IJ SOLN
INTRAMUSCULAR | Status: DC | PRN
  Administered 2017-06-29: 100 mg via INTRAVENOUS

## 2017-06-29 MED ORDER — HYDROMORPHONE HCL 1 MG/ML IJ SOLN
0.2500 mg | INTRAMUSCULAR | Status: DC | PRN
  Administered 2017-06-29 (×2): 0.5 mg via INTRAVENOUS

## 2017-06-29 MED ORDER — PHENYLEPHRINE 40 MCG/ML (10ML) SYRINGE FOR IV PUSH (FOR BLOOD PRESSURE SUPPORT)
PREFILLED_SYRINGE | INTRAVENOUS | Status: AC
  Filled 2017-06-29: qty 10

## 2017-06-29 MED ORDER — ONDANSETRON HCL 4 MG/2ML IJ SOLN: 4.0000 mg | Freq: Four times a day (QID) | INTRAMUSCULAR | Status: DC | PRN

## 2017-06-29 MED ORDER — ALBUTEROL SULFATE (2.5 MG/3ML) 0.083% IN NEBU: 2.5000 mg | INHALATION_SOLUTION | RESPIRATORY_TRACT | Status: DC | PRN

## 2017-06-29 MED ORDER — PHENYLEPHRINE HCL 10 MG/ML IJ SOLN
INTRAVENOUS | Status: DC | PRN
  Administered 2017-06-29: 25 ug/min via INTRAVENOUS

## 2017-06-29 MED ORDER — PROPOFOL 10 MG/ML IV BOLUS
INTRAVENOUS | Status: DC | PRN
  Administered 2017-06-29: 200 mg via INTRAVENOUS

## 2017-06-29 MED ORDER — SUGAMMADEX SODIUM 200 MG/2ML IV SOLN
INTRAVENOUS | Status: DC | PRN
  Administered 2017-06-29: 261.6 mg via INTRAVENOUS

## 2017-06-29 MED ORDER — EPHEDRINE 5 MG/ML INJ
INTRAVENOUS | Status: AC
  Filled 2017-06-29: qty 10

## 2017-06-29 MED ORDER — SODIUM CHLORIDE 0.9% FLUSH: 9.0000 mL | INTRAVENOUS | Status: DC | PRN

## 2017-06-29 MED ORDER — SCOPOLAMINE 1 MG/3DAYS TD PT72
MEDICATED_PATCH | TRANSDERMAL | Status: AC
  Filled 2017-06-29: qty 1

## 2017-06-29 MED ORDER — ORAL CARE MOUTH RINSE
15.0000 mL | Freq: Two times a day (BID) | OROMUCOSAL | Status: DC
  Administered 2017-06-29 – 2017-07-02 (×4): 15 mL via OROMUCOSAL

## 2017-06-29 MED ORDER — BENAZEPRIL HCL 20 MG PO TABS
20.0000 mg | ORAL_TABLET | Freq: Every day | ORAL | Status: DC
  Administered 2017-06-30 – 2017-07-02 (×3): 20 mg via ORAL
  Filled 2017-06-29 (×3): qty 1

## 2017-06-29 MED ORDER — TRIAMTERENE-HCTZ 37.5-25 MG PO TABS
1.0000 | ORAL_TABLET | Freq: Every day | ORAL | Status: DC
  Administered 2017-06-30 – 2017-07-02 (×3): 1 via ORAL
  Filled 2017-06-29 (×3): qty 1

## 2017-06-29 MED ORDER — OXYCODONE HCL 5 MG PO TABS: 5.0000 mg | ORAL_TABLET | ORAL | Status: DC | PRN

## 2017-06-29 MED ORDER — BUPIVACAINE HCL (PF) 0.5 % IJ SOLN
INTRAMUSCULAR | Status: DC | PRN
  Administered 2017-06-29: 10 mL

## 2017-06-29 MED ORDER — ACETAMINOPHEN 160 MG/5ML PO SOLN: 1000.0000 mg | Freq: Four times a day (QID) | ORAL | Status: DC

## 2017-06-29 MED ORDER — DEXTROSE 5 % IV SOLN
1.5000 g | INTRAVENOUS | Status: AC
  Administered 2017-06-29: 1.5 g via INTRAVENOUS
  Filled 2017-06-29: qty 1.5

## 2017-06-29 MED ORDER — LORATADINE 10 MG PO TABS: 10.0000 mg | ORAL_TABLET | Freq: Every day | ORAL | Status: DC | PRN

## 2017-06-29 MED ORDER — ALBUTEROL SULFATE (2.5 MG/3ML) 0.083% IN NEBU: 2.5000 mg | INHALATION_SOLUTION | RESPIRATORY_TRACT | Status: DC

## 2017-06-29 MED ORDER — AZELASTINE HCL 0.1 % NA SOLN: 1.0000 | Freq: Every day | NASAL | Status: DC | PRN

## 2017-06-29 MED ORDER — 0.9 % SODIUM CHLORIDE (POUR BTL) OPTIME
TOPICAL | Status: DC | PRN
  Administered 2017-06-29: 1000 mL

## 2017-06-29 MED ORDER — DEXTROSE-NACL 5-0.9 % IV SOLN
INTRAVENOUS | Status: DC
  Administered 2017-06-29 (×2): via INTRAVENOUS

## 2017-06-29 MED ORDER — BUPIVACAINE 0.5 % ON-Q PUMP SINGLE CATH 400 ML
INJECTION | Status: AC | PRN
  Administered 2017-06-29: 400 mL

## 2017-06-29 MED ORDER — DEXAMETHASONE SODIUM PHOSPHATE 10 MG/ML IJ SOLN
INTRAMUSCULAR | Status: AC
  Filled 2017-06-29: qty 1

## 2017-06-29 MED ORDER — CEFUROXIME SODIUM 1.5 G IV SOLR
1.5000 g | Freq: Two times a day (BID) | INTRAVENOUS | Status: DC
  Filled 2017-06-29: qty 1.5

## 2017-06-29 MED ORDER — FENTANYL CITRATE (PF) 250 MCG/5ML IJ SOLN
INTRAMUSCULAR | Status: AC
  Filled 2017-06-29: qty 5

## 2017-06-29 MED ORDER — BUPIVACAINE HCL (PF) 0.5 % IJ SOLN
INTRAMUSCULAR | Status: AC
  Filled 2017-06-29: qty 30

## 2017-06-29 MED ORDER — SUGAMMADEX SODIUM 200 MG/2ML IV SOLN
INTRAVENOUS | Status: AC
  Filled 2017-06-29: qty 2

## 2017-06-29 MED ORDER — LACTATED RINGERS IV SOLN
INTRAVENOUS | Status: DC | PRN
  Administered 2017-06-29 (×2): via INTRAVENOUS

## 2017-06-29 MED ORDER — PRAVASTATIN SODIUM 20 MG PO TABS
20.0000 mg | ORAL_TABLET | Freq: Every day | ORAL | Status: DC
  Administered 2017-06-30 – 2017-07-01 (×2): 20 mg via ORAL
  Filled 2017-06-29 (×2): qty 1

## 2017-06-29 MED ORDER — DIPHENHYDRAMINE HCL 12.5 MG/5ML PO ELIX: 12.5000 mg | ORAL_SOLUTION | Freq: Four times a day (QID) | ORAL | Status: DC | PRN

## 2017-06-29 MED ORDER — NALOXONE HCL 0.4 MG/ML IJ SOLN: 0.4000 mg | INTRAMUSCULAR | Status: DC | PRN

## 2017-06-29 MED ORDER — PANTOPRAZOLE SODIUM 40 MG PO TBEC
40.0000 mg | DELAYED_RELEASE_TABLET | Freq: Every day | ORAL | Status: DC
  Administered 2017-06-29 – 2017-07-02 (×4): 40 mg via ORAL
  Filled 2017-06-29 (×4): qty 1

## 2017-06-29 MED ORDER — BISACODYL 5 MG PO TBEC
10.0000 mg | DELAYED_RELEASE_TABLET | Freq: Every day | ORAL | Status: DC
  Administered 2017-06-29 – 2017-07-01 (×3): 10 mg via ORAL
  Filled 2017-06-29 (×3): qty 2

## 2017-06-29 MED ORDER — ENOXAPARIN SODIUM 40 MG/0.4ML ~~LOC~~ SOLN
40.0000 mg | Freq: Every day | SUBCUTANEOUS | Status: DC
  Administered 2017-06-30 – 2017-07-02 (×3): 40 mg via SUBCUTANEOUS
  Filled 2017-06-29 (×3): qty 0.4

## 2017-06-29 MED ORDER — SCOPOLAMINE 1 MG/3DAYS TD PT72
MEDICATED_PATCH | TRANSDERMAL | Status: DC | PRN
  Administered 2017-06-29: 1 via TRANSDERMAL

## 2017-06-29 MED ORDER — ACETAMINOPHEN 500 MG PO TABS
1000.0000 mg | ORAL_TABLET | Freq: Four times a day (QID) | ORAL | Status: DC
  Administered 2017-06-29 – 2017-07-02 (×12): 1000 mg via ORAL
  Filled 2017-06-29 (×13): qty 2

## 2017-06-29 MED ORDER — ROCURONIUM BROMIDE 10 MG/ML (PF) SYRINGE
PREFILLED_SYRINGE | INTRAVENOUS | Status: AC
  Filled 2017-06-29: qty 5

## 2017-06-29 MED ORDER — AMLODIPINE BESY-BENAZEPRIL HCL 10-20 MG PO CAPS: 1.0000 | ORAL_CAPSULE | Freq: Every day | ORAL | Status: DC

## 2017-06-29 MED ORDER — ROCURONIUM BROMIDE 100 MG/10ML IV SOLN
INTRAVENOUS | Status: DC | PRN
  Administered 2017-06-29: 50 mg via INTRAVENOUS
  Administered 2017-06-29: 20 mg via INTRAVENOUS
  Administered 2017-06-29: 10 mg via INTRAVENOUS

## 2017-06-29 MED ORDER — LIDOCAINE HCL (CARDIAC) 20 MG/ML IV SOLN
INTRAVENOUS | Status: DC | PRN
  Administered 2017-06-29: 60 mg via INTRAVENOUS

## 2017-06-29 MED ORDER — FENTANYL CITRATE (PF) 100 MCG/2ML IJ SOLN
INTRAMUSCULAR | Status: DC | PRN
  Administered 2017-06-29: 100 ug via INTRAVENOUS
  Administered 2017-06-29 (×3): 50 ug via INTRAVENOUS

## 2017-06-29 MED ORDER — SENNOSIDES-DOCUSATE SODIUM 8.6-50 MG PO TABS
1.0000 | ORAL_TABLET | Freq: Every day | ORAL | Status: DC
  Administered 2017-06-29 – 2017-07-01 (×3): 1 via ORAL
  Filled 2017-06-29 (×3): qty 1

## 2017-06-29 MED ORDER — DEXAMETHASONE SODIUM PHOSPHATE 10 MG/ML IJ SOLN
INTRAMUSCULAR | Status: DC | PRN
  Administered 2017-06-29: 6 mg via INTRAVENOUS

## 2017-06-29 MED ORDER — ONDANSETRON HCL 4 MG/2ML IJ SOLN
INTRAMUSCULAR | Status: DC | PRN
  Administered 2017-06-29: 4 mg via INTRAVENOUS

## 2017-06-29 SURGICAL SUPPLY — 86 items
BENZOIN TINCTURE PRP APPL 2/3 (GAUZE/BANDAGES/DRESSINGS) ×4 IMPLANT
CANISTER SUCT 3000ML PPV (MISCELLANEOUS) ×8 IMPLANT
CATH KIT ON Q 5IN SLV (PAIN MANAGEMENT) IMPLANT
CATH KIT ON-Q SILVERSOAK 5IN (CATHETERS) ×4 IMPLANT
CATH THORACIC 28FR (CATHETERS) ×4 IMPLANT
CATH THORACIC 36FR (CATHETERS) IMPLANT
CATH THORACIC 36FR RT ANG (CATHETERS) IMPLANT
CLIP TI MEDIUM 6 (CLIP) ×4 IMPLANT
CONN ST 1/4X3/8  BEN (MISCELLANEOUS)
CONN ST 1/4X3/8 BEN (MISCELLANEOUS) IMPLANT
CONN Y 3/8X3/8X3/8  BEN (MISCELLANEOUS)
CONN Y 3/8X3/8X3/8 BEN (MISCELLANEOUS) IMPLANT
CONT SPEC 4OZ CLIKSEAL STRL BL (MISCELLANEOUS) ×32 IMPLANT
COVER SURGICAL LIGHT HANDLE (MISCELLANEOUS) ×4 IMPLANT
DECANTER SPIKE VIAL GLASS SM (MISCELLANEOUS) ×4 IMPLANT
DERMABOND ADVANCED (GAUZE/BANDAGES/DRESSINGS) ×2
DERMABOND ADVANCED .7 DNX12 (GAUZE/BANDAGES/DRESSINGS) ×2 IMPLANT
DRAIN CHANNEL 28F RND 3/8 FF (WOUND CARE) IMPLANT
DRAIN CHANNEL 32F RND 10.7 FF (WOUND CARE) IMPLANT
DRAPE LAPAROSCOPIC ABDOMINAL (DRAPES) ×4 IMPLANT
DRAPE WARM FLUID 44X44 (DRAPE) ×4 IMPLANT
ELECT BLADE 6.5 EXT (BLADE) ×4 IMPLANT
ELECT REM PT RETURN 9FT ADLT (ELECTROSURGICAL) ×4
ELECTRODE REM PT RTRN 9FT ADLT (ELECTROSURGICAL) ×2 IMPLANT
GAUZE SPONGE 4X4 12PLY STRL (GAUZE/BANDAGES/DRESSINGS) ×4 IMPLANT
GAUZE SPONGE 4X4 12PLY STRL LF (GAUZE/BANDAGES/DRESSINGS) ×4 IMPLANT
GLOVE SURG SIGNA 7.5 PF LTX (GLOVE) ×8 IMPLANT
GOWN STRL REUS W/ TWL LRG LVL3 (GOWN DISPOSABLE) ×4 IMPLANT
GOWN STRL REUS W/ TWL XL LVL3 (GOWN DISPOSABLE) ×4 IMPLANT
GOWN STRL REUS W/TWL LRG LVL3 (GOWN DISPOSABLE) ×4
GOWN STRL REUS W/TWL XL LVL3 (GOWN DISPOSABLE) ×4
HANDLE STAPLE ENDO GIA SHORT (STAPLE)
HEMOSTAT SURGICEL 2X14 (HEMOSTASIS) IMPLANT
KIT BASIN OR (CUSTOM PROCEDURE TRAY) ×4 IMPLANT
KIT ROOM TURNOVER OR (KITS) ×4 IMPLANT
KIT SUCTION CATH 14FR (SUCTIONS) ×4 IMPLANT
NS IRRIG 1000ML POUR BTL (IV SOLUTION) ×12 IMPLANT
PACK CHEST (CUSTOM PROCEDURE TRAY) ×4 IMPLANT
PAD ARMBOARD 7.5X6 YLW CONV (MISCELLANEOUS) ×8 IMPLANT
POUCH ENDO CATCH II 15MM (MISCELLANEOUS) IMPLANT
POUCH SPECIMEN RETRIEVAL 10MM (ENDOMECHANICALS) ×4 IMPLANT
RELOAD STAPLER GOLD 60MM (STAPLE) ×14 IMPLANT
SCISSORS ENDO CVD 5DCS (MISCELLANEOUS) IMPLANT
SEALANT PROGEL (MISCELLANEOUS) IMPLANT
SEALANT SURG COSEAL 4ML (VASCULAR PRODUCTS) IMPLANT
SEALANT SURG COSEAL 8ML (VASCULAR PRODUCTS) IMPLANT
SHEARS HARMONIC HDI 36CM (ELECTROSURGICAL) IMPLANT
SOLUTION ANTI FOG 6CC (MISCELLANEOUS) ×4 IMPLANT
SPECIMEN JAR MEDIUM (MISCELLANEOUS) ×4 IMPLANT
SPONGE INTESTINAL PEANUT (DISPOSABLE) ×28 IMPLANT
SPONGE TONSIL 1 RF SGL (DISPOSABLE) ×4 IMPLANT
STAPLE ECHEON FLEX 60 POW ENDO (STAPLE) ×4 IMPLANT
STAPLER ENDO GIA 12MM SHORT (STAPLE) IMPLANT
STAPLER RELOAD GOLD 60MM (STAPLE) ×28
SUT PROLENE 4 0 RB 1 (SUTURE) ×2
SUT PROLENE 4-0 RB1 .5 CRCL 36 (SUTURE) IMPLANT
SUT PROLENE 4-0 RB1 18X2 ARM (SUTURE) ×2 IMPLANT
SUT SILK  1 MH (SUTURE) ×4
SUT SILK 1 MH (SUTURE) ×4 IMPLANT
SUT SILK 1 TIES 10X30 (SUTURE) ×4 IMPLANT
SUT SILK 2 0 SH (SUTURE) IMPLANT
SUT SILK 2 0SH CR/8 30 (SUTURE) IMPLANT
SUT SILK 3 0 SH 30 (SUTURE) IMPLANT
SUT SILK 3 0SH CR/8 30 (SUTURE) ×4 IMPLANT
SUT VIC AB 0 CTX 27 (SUTURE) IMPLANT
SUT VIC AB 1 CTX 27 (SUTURE) ×4 IMPLANT
SUT VIC AB 2-0 CT1 27 (SUTURE)
SUT VIC AB 2-0 CT1 TAPERPNT 27 (SUTURE) IMPLANT
SUT VIC AB 2-0 CTX 36 (SUTURE) ×4 IMPLANT
SUT VIC AB 3-0 MH 27 (SUTURE) IMPLANT
SUT VIC AB 3-0 SH 27 (SUTURE)
SUT VIC AB 3-0 SH 27X BRD (SUTURE) IMPLANT
SUT VIC AB 3-0 X1 27 (SUTURE) ×4 IMPLANT
SUT VICRYL 0 UR6 27IN ABS (SUTURE) IMPLANT
SUT VICRYL 2 TP 1 (SUTURE) IMPLANT
SWAB CULTURE ESWAB REG 1ML (MISCELLANEOUS) IMPLANT
SYSTEM SAHARA CHEST DRAIN ATS (WOUND CARE) ×4 IMPLANT
TAPE CLOTH SURG 4X10 WHT LF (GAUZE/BANDAGES/DRESSINGS) ×4 IMPLANT
TIP APPLICATOR SPRAY EXTEND 16 (VASCULAR PRODUCTS) IMPLANT
TOWEL GREEN STERILE (TOWEL DISPOSABLE) ×4 IMPLANT
TOWEL GREEN STERILE FF (TOWEL DISPOSABLE) ×4 IMPLANT
TRAY FOLEY W/METER SILVER 16FR (SET/KITS/TRAYS/PACK) ×4 IMPLANT
TROCAR XCEL BLADELESS 5X75MML (TROCAR) ×4 IMPLANT
TROCAR XCEL NON-BLD 5MMX100MML (ENDOMECHANICALS) IMPLANT
TUNNELER SHEATH ON-Q 11GX8 DSP (PAIN MANAGEMENT) ×4 IMPLANT
WATER STERILE IRR 1000ML POUR (IV SOLUTION) ×8 IMPLANT

## 2017-06-29 NOTE — Progress Notes (Signed)
      [address]       [place] [address]             [phone]      S/p Left VATS, wedge  Some pain from chest tube  BP 124/68   Pulse 62   Temp 97.6 F (36.4 C) (Oral)   Resp 13   Ht 5\' 4"  (1.626 m)   Wt 144 lb 2.9 oz (65.4 kg)   SpO2 96%   BMI 24.75 kg/m    Intake/Output Summary (Last 24 hours) at 06/29/17 1810 Last data filed at 06/29/17 1700  Gross per 24 hour  Intake          2176.42 ml  Output             1850 ml  Net           326.42 ml    Doing well early postop  Remo Lipps C. Roxan Hockey, MD [facility] [phone]

## 2017-06-29 NOTE — Progress Notes (Signed)
Katrina Hunter with [employer] at bedside and programmed pacemaker to pre procedure settings.

## 2017-06-29 NOTE — Anesthesia Preprocedure Evaluation (Addendum)
Anesthesia Evaluation  Patient identified by MRN, date of birth, ID band Patient awake    Reviewed: Allergy & Precautions, H&P , NPO status , Patient's Chart, lab work & pertinent test results  Airway Mallampati: II   Neck ROM: full    Dental  (+) Dental Advisory Given, Teeth Intact   Pulmonary  LLL nodule   breath sounds clear to auscultation       Cardiovascular hypertension, + dysrhythmias + pacemaker  Rhythm:regular Rate:Normal     Neuro/Psych  Headaches,    GI/Hepatic   Endo/Other  Hypothyroidism   Renal/GU      Musculoskeletal   Abdominal   Peds  Hematology   Anesthesia Other Findings   Reproductive/Obstetrics                            Anesthesia Physical Anesthesia Plan  ASA: III  Anesthesia Plan: General   Post-op Pain Management:    Induction: Intravenous  PONV Risk Score and Plan: 4 or greater and Ondansetron, Dexamethasone, Propofol, Midazolam and Scopolamine patch - Pre-op  Airway Management Planned: Double Lumen EBT  Additional Equipment: Arterial line  Intra-op Plan:   Post-operative Plan: Extubation in OR  Informed Consent: I have reviewed the patients History and Physical, chart, labs and discussed the procedure including the risks, benefits and alternatives for the proposed anesthesia with the patient or authorized representative who has indicated his/her understanding and acceptance.     Plan Discussed with: CRNA, Anesthesiologist and Surgeon  Anesthesia Plan Comments:         Anesthesia Quick Evaluation

## 2017-06-29 NOTE — Transfer of Care (Signed)
Immediate Anesthesia Transfer of Care Note  Patient: Katrina Hunter  Procedure(s) Performed: Procedure(s): VIDEO ASSISTED THORACOSCOPY (VATS)/WEDGE RESECTION (Left) LYMPH NODE DISSECTION LEFT LUNG (Left)  Patient Location: PACU  Anesthesia Type:General  Level of Consciousness: awake, alert  and sedated  Airway & Oxygen Therapy: Patient connected to face mask oxygen  Post-op Assessment: Post -op Vital signs reviewed and stable  Post vital signs: stable  Last Vitals:  Vitals:   06/29/17 0610  BP: (!) 155/67  Pulse: 69  Resp: 18  Temp: 36.6 C    Last Pain:  Vitals:   06/29/17 0610  TempSrc: Oral         Complications: No apparent anesthesia complications

## 2017-06-29 NOTE — Interval H&P Note (Signed)
History and Physical Interval Note:  06/29/2017 7:17 AM  Katrina Hunter  has presented today for surgery, with the diagnosis of LLL NODULE  The various methods of treatment have been discussed with the patient and family. After consideration of risks, benefits and other options for treatment, the patient has consented to  Procedure(s): VIDEO ASSISTED THORACOSCOPY (VATS)/WEDGE RESECTION (Left) possible SEGMENTECTOMY (Left) as a surgical intervention .  The patient's history has been reviewed, patient examined, no change in status, stable for surgery.  I have reviewed the patient's chart and labs.  Questions were answered to the patient's satisfaction.     Melrose Nakayama

## 2017-06-29 NOTE — Anesthesia Procedure Notes (Addendum)
Arterial Line Insertion Start/End7/01/2017 7:00 AM, 06/29/2017 7:15 AM Performed by: Lavell Luster, CRNA  Patient location: Pre-op. Preanesthetic checklist: patient identified, IV checked, site marked, risks and benefits discussed, surgical consent, monitors and equipment checked and pre-op evaluation Lidocaine 1% used for infiltration Left, radial was placed Catheter size: 20 G  Attempts: 1 Procedure performed without using ultrasound guided technique. Following insertion, Biopatch and dressing applied. Post procedure assessment: unchanged  Patient tolerated the procedure well with no immediate complications.

## 2017-06-29 NOTE — Anesthesia Procedure Notes (Addendum)
Procedure Name: Intubation Date/Time: 06/29/2017 7:40 AM Performed by: Marcie Bal, ADAM Pre-anesthesia Checklist: Patient identified, Emergency Drugs available, Suction available, Patient being monitored and Timeout performed Patient Re-evaluated:Patient Re-evaluated prior to inductionOxygen Delivery Method: Circle system utilized Preoxygenation: Pre-oxygenation with 100% oxygen Intubation Type: IV induction Ventilation: Mask ventilation without difficulty Laryngoscope Size: Mac and 3 Grade View: Grade III Tube type: Oral Endobronchial tube: Left and EBT position confirmed by fiberoptic bronchoscope and 37 Fr Number of attempts: 2 Airway Equipment and Method: Stylet Placement Confirmation: positive ETCO2 and breath sounds checked- equal and bilateral Tube secured with: Tape Dental Injury: Teeth and Oropharynx as per pre-operative assessment  Difficulty Due To: Difficulty was unanticipated, Difficult Airway- due to dentition and Difficult Airway- due to anterior larynx Future Recommendations: Recommend- induction with short-acting agent, and alternative techniques readily available Comments: DL x 1 with poor visualization of cords, grade III view at best.  Unable to pass DLT.  Dr Marcie Bal DL with same view, passed DLT and verified placement without incident.  Henderson Cloud, CRNA

## 2017-06-29 NOTE — Progress Notes (Signed)
Call to [facility] with [employer], explained the need for the pacemaker  to be put in asynchrony. She acknowledges.

## 2017-06-29 NOTE — Anesthesia Postprocedure Evaluation (Signed)
Anesthesia Post Note  Patient: Katrina Hunter  Procedure(s) Performed: Procedure(s) (LRB): VIDEO ASSISTED THORACOSCOPY (VATS)/WEDGE RESECTION (Left) LYMPH NODE DISSECTION LEFT LUNG (Left)     Patient location during evaluation: PACU Anesthesia Type: General Level of consciousness: awake and alert and patient cooperative Pain management: pain level controlled Vital Signs Assessment: post-procedure vital signs reviewed and stable Respiratory status: spontaneous breathing and respiratory function stable Cardiovascular status: stable Anesthetic complications: no    Last Vitals:  Vitals:   06/29/17 1500 06/29/17 1600  BP: 120/64 130/66  Pulse: 60 67  Resp: 12 20  Temp: 36.4 C     Last Pain:  Vitals:   06/29/17 1600  TempSrc:   PainSc: 2                  Malayla Granberry S

## 2017-06-29 NOTE — Brief Op Note (Addendum)
06/29/2017  10:28 AM  PATIENT:  Katrina Hunter  76 y.o. female  PRE-OPERATIVE DIAGNOSIS:  LLL NODULE  POST-OPERATIVE DIAGNOSIS:  ADENOCARCINOMA LEFT LOWER LOBE, CLINICAL STAGE IA  PROCEDURE:  Procedure(s): VIDEO ASSISTED THORACOSCOPY (VATS)/WEDGE RESECTION (Left) LYMPH NODE DISSECTION LEFT LUNG (Left)  ON-Q LOCAL ANESTHETIC CATHETER PLACEMENT  SURGEON:  Surgeon(s) and Role:    * Melrose Nakayama, MD - Primary  PHYSICIAN ASSISTANT: WAYNE GOLD PA-C  ANESTHESIA:   general  EBL:  Total I/O In: 1500 [I.V.:1500] Out: 450 [Urine:350; Blood:100]  BLOOD ADMINISTERED:none  DRAINS: 1  Chest Tube(s) in the LEFT HEMITHORAX   LOCAL MEDICATIONS USED:  MARCAINE     SPECIMEN:  Source of Specimen:  LLL WEDGE RESECTION  DISPOSITION OF SPECIMEN:  PATHOLOGY  COUNTS:  YES  PLAN OF CARE: Admit to inpatient   PATIENT DISPOSITION:  PACU - hemodynamically stable.   Delay start of Pharmacological VTE agent (>24hrs) due to surgical blood loss or risk of bleeding: yes  COMPLICATIONS:NO KNOWN  FROZEN-  Well differentiated adenocarcinoma, margins negative

## 2017-06-29 NOTE — H&P (View-Only) (Signed)
PCP is [name], MD Referring Provider is Brand Males, MD  Chief Complaint  Patient presents with  . 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 She ended up needing a permanent pacemaker. During her preoperative evaluation and chest x-ray showed a lung nodule. CT showed a 1 cm groundglass opacity and she was followed. The groundglass opacity grew and in 2014 she had a navigational bronchoscopy which was negative. Continued follow-up showed progressive slow growth of the nodule. In January the nodule is up to 1.6 cm in size. A PET CT was done in April which showed the nodule was mildly hypermetabolic with an SUV of 1.7.  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  She denies cough, ingestion, shortness of breath, chest pain, pressure, or tightness, change in appetite, weight loss, headaches or visual changes, new bone or joint pain. Zubrod Score: At the time of surgery this patient's most appropriate activity status/level should be described as: [x]     0    Normal activity, no symptoms []     1    Restricted in physical strenuous activity but ambulatory, able to do out light work []     2    Ambulatory and capable of self care, unable to do work activities, up and about >50 % of waking hours                              []     3    Only limited self care, in bed greater than 50% of waking hours []     4    Completely disabled, no self care, confined to bed or chair []     5    Moribund   Past Medical History:  Diagnosis Date  . Aneurysm ([place])    aortic   . AV block, complete ([place])    PPM placed 07/2011 [facility]  . Heart murmur   . HLD (hyperlipidemia)   . HTN (hypertension)   .  Hypothyroidism     no rx  . Pacemaker 12  . Pulmonary nodule, left 04/22/2017   From order notes    Past Surgical History:  Procedure Laterality Date  . BLADDER SURGERY  01/03/2017  . PACEMAKER INSERTION  07/2011   [name]  . TONSILLECTOMY  age 65  . VIDEO BRONCHOSCOPY WITH ENDOBRONCHIAL NAVIGATION N/A 12/05/2013   Procedure: VIDEO BRONCHOSCOPY WITH ENDOBRONCHIAL NAVIGATION;  Surgeon: Collene Gobble, MD;  Location: [facility] OR;  Service: Thoracic;  Laterality: N/A;    Family History  Problem Relation Age of Onset  . Heart disease Father   . Breast cancer Mother 15    Social History Social History  Substance Use Topics  . Smoking status: Never Smoker  . Smokeless tobacco: Never Used  . Alcohol use No    Current Outpatient Prescriptions  Medication Sig Dispense Refill  . amLODipine-benazepril (LOTREL) 10-20 MG per capsule Take 1 capsule by mouth daily.      Marland Kitchen azelastine (ASTELIN) 137 MCG/SPRAY nasal spray Place 1 spray into both nostrils daily as needed for allergies.     . cetirizine (ZYRTEC)  10 MG tablet Take 10 mg by mouth daily.     . cholecalciferol (VITAMIN D) 1000 UNITS tablet Take 1,000 Units by mouth. Take 2 tablets daily    . pravastatin (PRAVACHOL) 20 MG tablet Take 20 mg by mouth at bedtime.     . triamterene-hydrochlorothiazide (MAXZIDE-25) 37.5-25 MG per tablet Take 1 tablet by mouth daily.       No current facility-administered medications for this visit.     Allergies  Allergen Reactions  . Crestor [Rosuvastatin Calcium]     myalgia  . Sulfonamide Derivatives Rash    Review of Systems  Constitutional: Positive for fatigue (slowly improving after surgery in January). Negative for activity change, chills, fever and unexpected weight change.  HENT: Negative for trouble swallowing and voice change.   Eyes: Negative for visual disturbance.  Respiratory: Negative for cough, shortness of breath and wheezing.   Cardiovascular: Negative for chest pain, palpitations  and leg swelling.  Gastrointestinal: Negative for abdominal pain and blood in stool.  Genitourinary: Negative for difficulty urinating and dysuria.  Musculoskeletal: Negative for arthralgias and myalgias.  Neurological: Negative for dizziness, seizures and syncope.  Hematological: Negative for adenopathy. Does not bruise/bleed easily.  All other systems reviewed and are negative.   BP 138/82   Pulse 94   Resp 20   Ht 5\' 4"  (1.626 m)   Wt 145 lb (65.8 kg)   SpO2 97% Comment: RA  BMI 24.89 kg/m  Physical Exam  Constitutional: She is oriented to person, place, and time. She appears well-developed and well-nourished. No distress.  HENT:  Head: Normocephalic and atraumatic.  Eyes: Conjunctivae and EOM are normal. No scleral icterus.  Neck: Neck supple. No thyromegaly present.  Cardiovascular: Normal rate, regular rhythm and normal heart sounds.  Exam reveals no gallop and no friction rub.   No murmur heard. Pulmonary/Chest: Effort normal and breath sounds normal. No respiratory distress. She has no wheezes. She has no rales.  Abdominal: She exhibits no distension. There is no tenderness.  Musculoskeletal: Normal range of motion. She exhibits no edema.  Lymphadenopathy:    She has no cervical adenopathy.  Neurological: She is alert and oriented to person, place, and time. No cranial nerve deficit. She exhibits normal muscle tone.  Vitals reviewed.    Diagnostic Tests: NUCLEAR MEDICINE PET SKULL BASE TO THIGH  TECHNIQUE: 12.5 mCi F-18 FDG was injected intravenously. Full-ring PET imaging was performed from the skull base to thigh after the radiotracer. CT data was obtained and used for attenuation correction and anatomic localization.  FASTING BLOOD GLUCOSE:  Value: 85 mg/dl  COMPARISON:  Multiple exams, including PET-CT from 09/04/2011 and CT chest from 01/28/2017  FINDINGS: NECK  No hypermetabolic lymph nodes in the neck.  CHEST  In the region of the  ill-defined spiculated 1.5 by 1.6 cm left lower lobe pulmonary nodule, maximum SUV is 1.7.  No hypermetabolic adenopathy. Coronary, aortic arch, and branch vessel atherosclerotic vascular disease. Stable ascending aortic aneurysm, by my measurements 4.5 cm when measured perpendicular to the direction of the aorta.  Faint accentuation of metabolic activity in the distal esophagus without CT correlate, likely physiologic. Pacer leads noted.  ABDOMEN/PELVIS  Physiologic activity in the bowel, notably the cecum and ascending colon. No definite CT correlate.  Scattered sigmoid colon diverticula. Aortoiliac atherosclerotic vascular disease.  Cholelithiasis. 3.9 cm subserosal uterine fibroid noted eccentric to the right, without accentuated hypermetabolic activity compared to the myometrium.  SKELETON  No focal hypermetabolic activity to suggest skeletal metastasis.  IMPRESSION: 1. The ill-defined 1.5 by 1.6 cm left lower lobe pulmonary nodule has a maximum SUV of only 1.7. Given the slow enlargement the natural history of this lesion over the last 6 years, I am suspicious for low-grade adenocarcinoma, which often does not have significantly elevated SUV. 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 2. Ascending thoracic aortic aneurysm is measured at 4.5 cm 1 performed transverse to the axis of the aorta. Ascending thoracic aortic aneurysm. Recommend semi-annual imaging followup by CTA or MRA and referral to cardiothoracic surgery if not already obtained. This recommendation follows 2010 ACCF/AHA/AATS/ACR/ASA/SCA/SCAI/SIR/STS/SVM Guidelines for the Diagnosis and Management of Patients With Thoracic Aortic Disease. Circulation. 2010; 121: W098-J191 3.  Aortic Atherosclerosis (ICD10-I70.0). 4. Other imaging findings of potential clinical significance: Cholelithiasis. Uterine fibroid.  Scattered sigmoid colon diverticula.   Electronically Signed   By: Van Clines M.D.   On: 04/22/2017 12:24  I personally reviewed the PET CT as well as her previous CTs and concur with the findings noted above.  Impression: 76 year old woman with a 1.6 x 1.5 cm left lower lobe nodule that has both solid and sub- solid components with low-grade activity on PET/CT. 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 Other potential causes would be a indolent infection or an very unusual inflammatory nodule. Regardless given that the nodule is growing would need to be considered a low-grade adenocarcinoma was to be proven otherwise.  I discussed several different options with Katrina Hunter and her husband. One would be continued radiographic follow-up. I did not recommend that. Second option would be navigational bronchoscopy for diagnostic purposes. She understands just because it was negative before does not mean it will be negative the second time. That would be the preferred option if she was planning to treat with radiation. The third option would be to proceed with surgical resection. The nodule is central in the lower lobe and relatively deep so it would be difficult to resect with an adequate margin with just a wedge resection. I do think a segmentectomy would be an option. We discussed the advantages and disadvantages of each of those approaches. She is very likely reluctant to have surgery, but her husband strongly favors resection.  I described the proposed operation to her. It would be a left VATS and wedge resection or segmentectomy. If positive on frozen section we will remove lymph nodes as well. I informed her of the general nature of the operation including the need for general anesthesia, the incisions to be used, the use of the drainage tube postoperatively, the expected hospital stay, and the overall recovery. I informed her of the indications,  risks, benefits, and alternatives. She understands the risks include, but are not limited to death, MI, DVT, PE, bleeding, possible need for transfusion, infection, prolonged air leak, cardiac arrhythmias, as well as possibility of other unforeseeable complications.  After a long discussion Katrina Hunter wishes to proceed with left VATS for resection.  Ascending aneurysm- the aneurysm is unchanged although there is a wide variety of measurements given by radiology ranging from 4.5 cm on the recent PET/CT to 5.1 cm on the CT from January. I suspect 5.1 similar measurement comes in an area where there is significant curvature the aorta and that accounts for the difference. There is no indication for surgery at the present time but she does need continued follow-up of that every 6 months.  Plan:  Left VATS for wedge resection versus segmentectomy for left lower lobe nodule on Monday, 06/29/2017  Melrose Nakayama, MD [facility] [phone]

## 2017-06-29 NOTE — Progress Notes (Signed)
Tomi Bamberger with [employer] called to come make sure patients pacemaker on correct settings. Will come to PACU bay 9

## 2017-06-30 ENCOUNTER — Encounter (HOSPITAL_COMMUNITY): Payer: Self-pay | Admitting: Thoracic Surgery (Cardiothoracic Vascular Surgery)

## 2017-06-30 ENCOUNTER — Inpatient Hospital Stay (HOSPITAL_COMMUNITY): Payer: Medicare Other

## 2017-06-30 LAB — BLOOD GAS, ARTERIAL
Acid-base deficit: 0.4 mmol/L (ref 0.0–2.0)
Bicarbonate: 23.6 mmol/L (ref 20.0–28.0)
O2 Content: 1 L/min
O2 SAT: 98 %
PATIENT TEMPERATURE: 98.6
PCO2 ART: 38.3 mmHg (ref 32.0–48.0)
PO2 ART: 101 mmHg (ref 83.0–108.0)
pH, Arterial: 7.407 (ref 7.350–7.450)

## 2017-06-30 LAB — BASIC METABOLIC PANEL
ANION GAP: 7 (ref 5–15)
BUN: 9 mg/dL (ref 6–20)
CALCIUM: 8.2 mg/dL — AB (ref 8.9–10.3)
CO2: 23 mmol/L (ref 22–32)
Chloride: 106 mmol/L (ref 101–111)
Creatinine, Ser: 0.86 mg/dL (ref 0.44–1.00)
GLUCOSE: 144 mg/dL — AB (ref 65–99)
POTASSIUM: 3 mmol/L — AB (ref 3.5–5.1)
Sodium: 136 mmol/L (ref 135–145)

## 2017-06-30 LAB — CBC
HEMATOCRIT: 32.9 % — AB (ref 36.0–46.0)
HEMOGLOBIN: 10.7 g/dL — AB (ref 12.0–15.0)
MCH: 27.5 pg (ref 26.0–34.0)
MCHC: 32.5 g/dL (ref 30.0–36.0)
MCV: 84.6 fL (ref 78.0–100.0)
Platelets: 231 10*3/uL (ref 150–400)
RBC: 3.89 MIL/uL (ref 3.87–5.11)
RDW: 14 % (ref 11.5–15.5)
WBC: 8.6 10*3/uL (ref 4.0–10.5)

## 2017-06-30 MED ORDER — POTASSIUM CHLORIDE 10 MEQ/100ML IV SOLN
10.0000 meq | INTRAVENOUS | Status: AC
  Administered 2017-06-30 (×2): 10 meq via INTRAVENOUS
  Filled 2017-06-30 (×2): qty 100

## 2017-06-30 MED ORDER — KETOROLAC TROMETHAMINE 15 MG/ML IJ SOLN: 15.0000 mg | Freq: Four times a day (QID) | INTRAMUSCULAR | Status: DC

## 2017-06-30 MED ORDER — KETOROLAC TROMETHAMINE 15 MG/ML IJ SOLN: 15.0000 mg | Freq: Four times a day (QID) | INTRAMUSCULAR | Status: DC | PRN

## 2017-06-30 MED ORDER — POTASSIUM CHLORIDE 10 MEQ/100ML IV SOLN
10.0000 meq | INTRAVENOUS | Status: AC
  Administered 2017-06-30 (×3): 10 meq via INTRAVENOUS
  Filled 2017-06-30 (×3): qty 100

## 2017-06-30 NOTE — Op Note (Signed)
Katrina Hunter, Katrina Hunter                ACCOUNT NO.:  [id number]  MEDICAL RECORD NO.:  [id number]  LOCATION:                                 FACILITY:  PHYSICIAN:  Revonda Standard. Roxan Hockey, M.D. DATE OF BIRTH:  DATE OF PROCEDURE:  06/29/2017 DATE OF DISCHARGE:                              OPERATIVE REPORT   PREOPERATIVE DIAGNOSIS:  Left lower lobe nodule.  POSTOPERATIVE DIAGNOSIS:  Adenocarcinoma, left lower lobe, clinical stage IA.  PROCEDURE:   Left video-assisted thoracoscopy;  Wedge resection left lower lobe with mediastinal lymph node dissection; and On-Q local anesthetic catheter placement.  SURGEON:  Revonda Standard. Roxan Hockey, M.D.  ASSISTANT:  John Giovanni, P.A.-C.  ANESTHESIA:  General.  FINDINGS:  Fissure incomplete and difficult to dissect. Mass palpable in the posterior aspect of left lower lobe. Wedge resection performed with wide margins.  Frozen section revealed well-differentiated Adenocarcinoma. Possible carcinoid tumorlet at margin. No adenocarcinoma at margin.  CLINICAL NOTE:  Katrina Hunter is a 76 year old woman who has had a known lung nodule since 2012.  This nodule has progressively grown over the course of time and now is up to 1.6 cm.  A PET-CT showed it was mildly hypermetabolic with an SUV of 1.7.  She was advised to have surgical resection but was also offered the option of bronchoscopic biopsy and possible radiation.  The indications, risks, benefits, and alternatives were discussed in detail with the patient.  She understood and accepted the risks and agreed to proceed.  OPERATIVE NOTE:  Katrina Hunter was brought to the preop holding area on June 29, 2017.  Anesthesia placed a central line and an arterial blood pressure monitoring line.  She was taken to the operating room, anesthetized, and intubated with a double-lumen endotracheal tube. Sequential compression devices were placed for DVT prophylaxis. Intravenous antibiotics were administered.  A Foley  catheter was placed. She was placed in a right lateral decubitus position and the left chest was prepped and draped in usual sterile fashion.  Single lung ventilation of the right lung was initiated and was tolerated well throughout the procedure.  After performing a time-out, an incision was made in the seventh interspace in the midaxillary line.  A 5-mm port was inserted into the chest.  The thoracoscope was advanced into the chest.  There was no abnormality of the visceral or parietal pleura and no pleural effusion. A 5-cm working incision was made in the fifth interspace anterolaterally.  No rib spreading was performed during the procedure.  The lower lobe was inspected.  The nodule was difficult to palpate, but it was eventually palpable posteriorly.  An attempt was made to dissect into the fissure to identify the segmental pulmonary artery branches, but this was a very hostile fissure with a difficult dissection and no clear plane between the upper and lower lobes.  The inferior ligament was divided with electrocautery.  There were multiple level 9 lymph nodes encountered, they were removed and sent for pathology.  These were all benign in appearance.  The pleural reflection was divided at the hilum anteriorly and then posteriorly.  Posteriorly, a large but otherwise benign-appearing subcarinal lymph node was identified and was removed as  a separate specimen.  Anteriorly and superiorly, the aortopulmonary window was opened and a node was identified there and sent as well.  The decision was made to perform a wide wedge resection essentially removing the posterior basilar aspect of the left lower lobe.  This was done with sequential firings of an Echelon 60-mm powered stapler with gold cartridges.  Multiple firings of the stapler were performed and a 2-cm gross incision was maintained from the tumor.  The specimen was placed into an endoscopic retrieval bag and sent for  frozen section of the stapled margin.  The frozen section returned showing a well- differentiated adenocarcinoma.  The staple margin was free of carcinoma, but there was a possible carcinoid tumorlet noted.  While awaiting results of the frozen section, a stab incision was made posteriorly and an On-Q local anesthetic catheter was tunneled into a subpleural location. It was primed with 5 mL of 0.5% Marcaine and secured to skin with a 3-0 silk suture.  The chest was copiously irrigated with warm saline.  A test inflation to 30 cm of water revealed some mild air leakage from the fissure, but no air leakage from the staple line.  The 28-French chest tube was placed through the original port incision and secured to the skin with #1 silk suture.  The working incision was closed with a #1 Vicryl fascial suture, 2-0 Vicryl subcutaneous suture, and 3-0 Vicryl subcuticular suture.  All sponge, needle, and instrument counts were correct at the end of the procedure.  There were no intraoperative complications.  The patient was taken from the operating room to the postanesthetic care unit extubated and in good condition.     Revonda Standard Roxan Hockey, M.D.     SCH/MEDQ  D:  06/29/2017  T:  06/29/2017  Job:  [id number]

## 2017-06-30 NOTE — Care Management Note (Signed)
Case Management Note Marvetta Gibbons RN, BSN Unit 2W-Case Manager-- [place] coverage [phone]  Patient Details  Name: Katrina Hunter MRN: [id number] Date of Birth: June 02, 1941  Subjective/Objective:   Pt admitted s/p VATS with wedge resection on 06/29/17                 Action/Plan: PTA pt lived at home with spouse- CM to follow for d/c needs  Expected Discharge Date:                  Expected Discharge Plan:  Home/Self Care  In-House Referral:     Discharge planning Services  CM Consult  Post Acute Care Choice:    Choice offered to:     DME Arranged:    DME Agency:     HH Arranged:    HH Agency:     Status of Service:  In process, will continue to follow  If discussed at Long Length of Stay Meetings, dates discussed:    Discharge Disposition:   Additional Comments:  Dawayne Patricia, RN 06/30/2017, 11:39 AM

## 2017-06-30 NOTE — Progress Notes (Signed)
Report called to [facility]. Waiting on SWOT nurse to transfer patient.

## 2017-06-30 NOTE — Progress Notes (Signed)
Called patient's husband per his request to update him on confusion of patient.  At the time, patient had been resting comfortably and then woke up wanting to leave and stating she was in pain.  She spoke with her husband, leaving him more upset over her confusion.  Patient was able to be reoriented and repositioned so that she fell back asleep.  Husband was called again to update him, he felt that the MD needed to be consulted. RN attempted to educate patient's husband of the many causes of confusion in the hospital and that it is common and we would observe her closely.  RN called MD, he agreed with what the RN told the family and ordered Toradol to try to get the patient off of narcotics that may or may not be causing the confusion.  Katrina Hunter was notified again and updated with this information.  Will continue to monitor.

## 2017-06-30 NOTE — Progress Notes (Signed)
Upon assessment, patient is confused.  She answers questions appropriately but is confused in conversation.  She states that she is in the [employer].  This RN was told in report that this is how patient has been throughout the day.  Husband is concerned that "she is talking out of her head". Both patient and husband educated that confusion often happens in a hospital setting between being moved from ICU to SD and also the pain medications.  Educated both patient and husband on different types of medications and agreed that we try PO medications tonight.  Will continue to monitor.

## 2017-06-30 NOTE — Progress Notes (Signed)
1 Day Post-Op Procedure(s) (LRB): VIDEO ASSISTED THORACOSCOPY (VATS)/WEDGE RESECTION (Left) LYMPH NODE DISSECTION LEFT LUNG (Left) Subjective: Some pain from tube, denies nausea   Objective: Vital signs in last 24 hours: Temp:  [87.7 F (30.9 C)-98.6 F (37 C)] 98 F (36.7 C) (07/03 0300) Pulse Rate:  [59-85] 71 (07/03 0700) Cardiac Rhythm: A-V Sequential paced (07/03 0400) Resp:  [11-20] 19 (07/03 0700) BP: (107-149)/(63-91) 148/77 (07/03 0700) SpO2:  [93 %-100 %] 96 % (07/03 0700) Arterial Line BP: (60-159)/(50-71) 75/64 (07/03 0700) Weight:  [144 lb 2.9 oz (65.4 kg)-147 lb 0.8 oz (66.7 kg)] 147 lb 0.8 oz (66.7 kg) (07/03 0600)  Hemodynamic parameters for last 24 hours:    Intake/Output from previous day: 07/02 0701 - 07/03 0700 In: 4276.4 [I.V.:3760.4; IV Piggyback:350] Out: 1660 [Urine:3075; Blood:100; Chest Tube:280] Intake/Output this shift: No intake/output data recorded.  General appearance: alert, cooperative and no distress Neurologic: intact Heart: regular rate and rhythm Lungs: diminished breath sounds bibasilar Abdomen: normal findings: soft, non-tender no air leak  Lab Results:  Recent Labs  06/30/17 0319  WBC 8.6  HGB 10.7*  HCT 32.9*  PLT 231   BMET:  Recent Labs  06/30/17 0319  NA 136  K 3.0*  CL 106  CO2 23  GLUCOSE 144*  BUN 9  CREATININE 0.86  CALCIUM 8.2*    PT/INR: No results for input(s): LABPROT, INR in the last 72 hours. ABG    Component Value Date/Time   PHART 7.407 06/30/2017 0327   HCO3 23.6 06/30/2017 0327   ACIDBASEDEF 0.4 06/30/2017 0327   O2SAT 98.0 06/30/2017 0327   CBG (last 3)  No results for input(s): GLUCAP in the last 72 hours.  Assessment/Plan: S/P Procedure(s) (LRB): VIDEO ASSISTED THORACOSCOPY (VATS)/WEDGE RESECTION (Left) LYMPH NODE DISSECTION LEFT LUNG (Left) -  POD # 1  Looks good Pain control reasonable Continue IS CXR looks good No air leak- CT to water seal Advance diet Ambulate SCD +  enoxaparin for DVT prophylaxis   LOS: 1 day    Melrose Nakayama 06/30/2017

## 2017-07-01 ENCOUNTER — Inpatient Hospital Stay (HOSPITAL_COMMUNITY): Payer: Medicare Other

## 2017-07-01 LAB — CBC
HCT: 34.6 % — ABNORMAL LOW (ref 36.0–46.0)
Hemoglobin: 11.2 g/dL — ABNORMAL LOW (ref 12.0–15.0)
MCH: 27.9 pg (ref 26.0–34.0)
MCHC: 32.4 g/dL (ref 30.0–36.0)
MCV: 86.1 fL (ref 78.0–100.0)
Platelets: 265 K/uL (ref 150–400)
RBC: 4.02 MIL/uL (ref 3.87–5.11)
RDW: 14.4 % (ref 11.5–15.5)
WBC: 11.5 K/uL — ABNORMAL HIGH (ref 4.0–10.5)

## 2017-07-01 LAB — COMPREHENSIVE METABOLIC PANEL WITH GFR
ALT: 11 U/L — ABNORMAL LOW (ref 14–54)
AST: 24 U/L (ref 15–41)
Albumin: 2.9 g/dL — ABNORMAL LOW (ref 3.5–5.0)
Alkaline Phosphatase: 78 U/L (ref 38–126)
Anion gap: 7 (ref 5–15)
BUN: 9 mg/dL (ref 6–20)
CO2: 26 mmol/L (ref 22–32)
Calcium: 8.4 mg/dL — ABNORMAL LOW (ref 8.9–10.3)
Chloride: 104 mmol/L (ref 101–111)
Creatinine, Ser: 0.93 mg/dL (ref 0.44–1.00)
GFR calc Af Amer: >60 mL/min
GFR calc non Af Amer: 58 mL/min — ABNORMAL LOW
Glucose, Bld: 102 mg/dL — ABNORMAL HIGH (ref 65–99)
Potassium: 3.1 mmol/L — ABNORMAL LOW (ref 3.5–5.1)
Sodium: 137 mmol/L (ref 135–145)
Total Bilirubin: 1 mg/dL (ref 0.3–1.2)
Total Protein: 5.9 g/dL — ABNORMAL LOW (ref 6.5–8.1)

## 2017-07-01 MED ORDER — POTASSIUM CHLORIDE CRYS ER 20 MEQ PO TBCR
40.0000 meq | EXTENDED_RELEASE_TABLET | Freq: Once | ORAL | Status: AC
  Administered 2017-07-01: 40 meq via ORAL
  Filled 2017-07-01: qty 2

## 2017-07-01 NOTE — Progress Notes (Signed)
Wasted 47mcg Fentanyl PCA in sink with Marinda Elk., RN

## 2017-07-01 NOTE — Progress Notes (Addendum)
      [address]       [place] [address]             [phone]       2 Days Post-Op Procedure(s) (LRB): VIDEO ASSISTED THORACOSCOPY (VATS)/WEDGE RESECTION (Left) LYMPH NODE DISSECTION LEFT LUNG (Left)  Subjective: Patient alert and oriented. She wants to know when she can go home.  Objective: Vital signs in last 24 hours: Temp:  [97.8 F (36.6 C)-98.4 F (36.9 C)] 97.9 F (36.6 C) (07/04 0700) Pulse Rate:  [64-93] 93 (07/04 0700) Cardiac Rhythm: Ventricular paced (07/04 0859) Resp:  [18-23] 21 (07/04 0800) BP: (113-132)/(59-78) 130/78 (07/04 0700) SpO2:  [91 %-98 %] 93 % (07/04 0800) Weight:  [68 kg (149 lb 14.6 oz)] 68 kg (149 lb 14.6 oz) (07/03 1308)     Intake/Output from previous day: 07/03 0701 - 07/04 0700 In: 1515 [P.O.:240; I.V.:1025; IV Piggyback:250] Out: 8295 [Urine:3750; Chest Tube:110]   Physical Exam:  Cardiovascular: RRR Pulmonary: Clear to auscultation on the right and slightly diminished left base Abdomen: Soft, non tender, bowel sounds present. Extremities: Trace bilateral lower extremity edema. Wounds: Dressing is clean and dry.   Chest Tube: Chest tube is to water seal.There is tidling with cough but no true air leak.  Lab Results: CBC: Recent Labs  06/30/17 0319 07/01/17 0210  WBC 8.6 11.5*  HGB 10.7* 11.2*  HCT 32.9* 34.6*  PLT 231 265   BMET:  Recent Labs  06/30/17 0319 07/01/17 0210  NA 136 137  K 3.0* 3.1*  CL 106 104  CO2 23 26  GLUCOSE 144* 102*  BUN 9 9  CREATININE 0.86 0.93  CALCIUM 8.2* 8.4*    PT/INR: No results for input(s): LABPROT, INR in the last 72 hours. ABG:  INR: Will add last result for INR, ABG once components are confirmed Will add last 4 CBG results once components are confirmed  Assessment/Plan:  1. CV - SR in the 90's. 2.  Pulmonary - Chest tube output 110 cc last 24 hours. Chest tube to water seal. There is no air leak. CXR this am shows no pneumothorax, bibasilar  atelectasis. As discussed with Dr. Servando Snare, will remove chest tube. Check CXR in am.Encourage incentive spirometer. 3. Had confusion last evening but has since resolved. Will stop PCA and give Toradol, Ultram, etc. 4. Hypokalemia-supplement potassium 5. Decrease IVF 6. Possibly home in am  ZIMMERMAN,DONIELLE MPA-C 07/01/2017,10:27 AM  confucsion overnight Stop pca  Chest tube out today  I have seen and examined Beulah Gandy and agree with the above assessment  and plan.  Grace Isaac MD [facility] [phone] Office [phone] 07/01/2017 1:20 PM

## 2017-07-01 NOTE — Progress Notes (Signed)
Wasted 65mcg Fentanyl PCA in sink with Marinda Elk., RN

## 2017-07-02 ENCOUNTER — Inpatient Hospital Stay (HOSPITAL_COMMUNITY): Payer: Medicare Other

## 2017-07-02 MED ORDER — TRAMADOL HCL 50 MG PO TABS: 50.0000 mg | ORAL_TABLET | Freq: Four times a day (QID) | ORAL | 0 refills | Status: DC | PRN

## 2017-07-02 NOTE — Progress Notes (Signed)
Discharge note. Patient and husband educated at bedside. RN educated on medications and when to take them, follow up appointments, incision care, driving restrictions, activity restrictions, preventative breathing exercises, signs and symptoms to look out for, when to call the MD, and when to seek immediate medical attention. IVs removed without complication.

## 2017-07-02 NOTE — Progress Notes (Signed)
      [address]       [place] [address]             [phone]      3 Days Post-Op Procedure(s) (LRB): VIDEO ASSISTED THORACOSCOPY (VATS)/WEDGE RESECTION (Left) LYMPH NODE DISSECTION LEFT LUNG (Left) Subjective: Feels good this morning. Shares that she walked this morning   Objective: Vital signs in last 24 hours: Temp:  [97.6 F (36.4 C)-98.4 F (36.9 C)] 97.9 F (36.6 C) (07/05 0711) Pulse Rate:  [64-81] 76 (07/05 0711) Cardiac Rhythm: Ventricular paced (07/05 0817) Resp:  [15-22] 17 (07/05 0711) BP: (113-127)/(66-89) 125/86 (07/05 0711) SpO2:  [91 %-96 %] 93 % (07/05 0711)    Intake/Output from previous day: 07/04 0701 - 07/05 0700 In: 450 [P.O.:360; I.V.:90] Out: 550 [Urine:500; Chest Tube:50] Intake/Output this shift: No intake/output data recorded.  General appearance: alert, cooperative and no distress Heart: regular rate and rhythm, S1, S2 normal, no murmur, click, rub or gallop Lungs: clear to auscultation bilaterally Abdomen: soft, non-tender; bowel sounds normal; no masses,  no organomegaly Extremities: extremities normal, atraumatic, no cyanosis or edema Wound: clean and dry  Lab Results:  Recent Labs  06/30/17 0319 07/01/17 0210  WBC 8.6 11.5*  HGB 10.7* 11.2*  HCT 32.9* 34.6*  PLT 231 265   BMET:  Recent Labs  06/30/17 0319 07/01/17 0210  NA 136 137  K 3.0* 3.1*  CL 106 104  CO2 23 26  GLUCOSE 144* 102*  BUN 9 9  CREATININE 0.86 0.93  CALCIUM 8.2* 8.4*    PT/INR: No results for input(s): LABPROT, INR in the last 72 hours. ABG    Component Value Date/Time   PHART 7.407 06/30/2017 0327   HCO3 23.6 06/30/2017 0327   ACIDBASEDEF 0.4 06/30/2017 0327   O2SAT 98.0 06/30/2017 0327   CBG (last 3)  No results for input(s): GLUCAP in the last 72 hours.  Assessment/Plan: S/P Procedure(s) (LRB): VIDEO ASSISTED THORACOSCOPY (VATS)/WEDGE RESECTION (Left) LYMPH NODE DISSECTION LEFT LUNG (Left)  1. CV-70s NSR with  PPM.  2. Pulm-On room air with good saturation. CXR Negative for pneumothorax after left chest tube removal. Bibasilar subsegmental atelectasis and tiny left pleural effusion. 3. Renal-creatinine 0.93. Potassium replaced 4. Endo-blood glucose level well controlled 5. Pain controlled with Ultram and Toradol  Plan: Home today with husband. Ultram for pain.      LOS: 3 days    Elgie Collard 07/02/2017

## 2017-07-02 NOTE — Progress Notes (Signed)
Patient discharged by wheelchair with RN.

## 2017-07-02 NOTE — Discharge Summary (Signed)
Physician Discharge Summary  Patient ID: Katrina Hunter MRN: [id number] DOB/AGE: 1941-08-01 76 y.o.  Admit date: 06/29/2017 Discharge date: 07/02/2017  Admission Diagnoses: Patient Active Problem List   Diagnosis Date Noted  . 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 She ended up needing a permanent pacemaker. During her preoperative evaluation and chest x-ray showed a lung nodule. CT showed a 1 cm groundglass opacity and she was followed. The groundglass opacity grew and in 2014 she had a navigational bronchoscopy which was negative. Continued follow-up showed progressive slow growth of the nodule. In January the nodule is up to 1.6 cm in size. A PET CT was done in April which showed the nodule was mildly hypermetabolic with an SUV of 1.7. 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 She denies cough, ingestion, shortness of breath, chest pain, pressure, or tightness, change in appetite, weight loss, headaches or visual changes, new bone or joint pain.   Hospital Course:  On 06/29/2017 Katrina Hunter underwent a left video-assisted thoracoscopy, wedge resection with mediastinal lymph node dissection with Dr. Roxan Hockey. She tolerated the procedure well and was transfer to the ICU. POD 1 the patient had some confusion. After the PCA was discontinued and switched to Toradol, the confusion got better. POD 2 her chest tube was removed. We decreased her fluids since she was now eating. She remained hemodynamically stable. We replaced her potassium due to hypokalemia. POD 4 her chest xray was negative for pneumothorax after left chest tube removal. She continue to have some subsegmental atelectasis with a tiny pleural  effusion. She is ambulating with limited assistance, her pain is well controlled, her incision is healing well and she is ready for discharge home with her husband.   Consults: None  Significant Diagnostic Studies:  CLINICAL DATA:  Status post VATS and wedge resection on the left 06/29/2017 for a left lower lobe adenocarcinoma.  EXAM: CHEST  2 VIEW  COMPARISON:  Single-view of the chest 07/01/2017 and 06/30/2017.  FINDINGS: Left chest tube in place on yesterday's examination has been removed. No pneumothorax is identified. Tiny left pleural effusion and mild bibasilar subsegmental atelectasis are seen. Heart size is normal. Aortic atherosclerosis is noted.  IMPRESSION: Negative for pneumothorax after left chest tube removal.  Bibasilar subsegmental atelectasis and tiny left pleural effusion.   Electronically Signed   By: Inge Rise M.D.   On: 07/02/2017 07:28  Treatments:   NAME:  ELBERTA, LACHAPELLE                ACCOUNT NO.:  [id number]  MEDICAL RECORD NO.:  [id number]  LOCATION:                                 FACILITY:  PHYSICIAN:  Revonda Standard. Roxan Hockey, M.D. DATE OF BIRTH:  DATE OF PROCEDURE:  06/29/2017 DATE OF DISCHARGE:                              OPERATIVE REPORT   PREOPERATIVE DIAGNOSIS:  Left lower lobe nodule.  POSTOPERATIVE DIAGNOSIS:  Adenocarcinoma, left lower lobe, clinical stage IA.  PROCEDURE:  Left video-assisted thoracoscopy; wedge resection left lower lobe with mediastinal lymph node dissection; and on-Q local anesthetic catheter placement.  SURGEON:  Revonda Standard. Roxan Hockey, M.D.  ASSISTANT:  John Giovanni, P.A.-C.  ANESTHESIA:  General.  FINDINGS:  Fissure incomplete and difficult to dissect, mass palpable in the posterior aspect of left lower lobe, wide wedge resection performed with wide margins.  Frozen section revealed well-differentiated adenocarcinoma, possible carcinoid tumor at margin, no adenocarcinoma  at margin.  Discharge Exam: Blood pressure 124/74, pulse 83, temperature 98 F (36.7 C), temperature source Oral, resp. rate 19, height 5\' 4"  (1.626 m), weight 68 kg (149 lb 14.6 oz), SpO2 93 %.    General appearance: alert, cooperative and no distress Heart: regular rate and rhythm, S1, S2 normal, no murmur, click, rub or gallop Lungs: clear to auscultation bilaterally Abdomen: soft, non-tender; bowel sounds normal; no masses,  no organomegaly Extremities: extremities normal, atraumatic, no cyanosis or edema Wound: clean and dry   Disposition: 01-Home or Self Care  Discharge Instructions    Discharge patient    Complete by:  As directed    Discharge disposition:  01-Home or Self Care   Discharge patient date:  07/02/2017     Allergies as of 07/02/2017      Reactions   Crestor [rosuvastatin Calcium] Other (See Comments)   myalgia   Sulfonamide Derivatives Rash      Medication List    STOP taking these medications   ibuprofen 200 MG tablet Commonly known as:  ADVIL,MOTRIN   naproxen sodium 220 MG tablet Commonly known as:  ANAPROX     TAKE these medications   amLODipine-benazepril 10-20 MG capsule Commonly known as:  LOTREL Take 1 capsule by mouth daily.   azelastine 0.1 % nasal spray Commonly known as:  ASTELIN Place 1 spray into both nostrils daily as needed for allergies.   B-12 3000 MCG Caps Take 3,000 mcg by mouth daily.   cetirizine 10 MG tablet Commonly known as:  ZYRTEC Take 10 mg by mouth daily. Pt takes PRN   cholecalciferol 1000 units tablet Commonly known as:  VITAMIN D Take 2,000 Units by mouth daily.   pravastatin 20 MG tablet Commonly known as:  PRAVACHOL Take 20 mg by mouth at bedtime.   traMADol 50 MG tablet Commonly known as:  ULTRAM Take 1 tablet (50 mg total) by mouth every 6 (six) hours as needed for moderate pain.   triamterene-hydrochlorothiazide 37.5-25 MG tablet Commonly known as:  MAXZIDE-25 Take 1 tablet by mouth daily.       Follow-up Information    [name], MD. Call in 1 day(s).   Specialty:  Internal Medicine Contact information: [address] [place] [address] [phone]        Melrose Nakayama, MD Follow up.   Specialty:  Cardiothoracic Surgery Why:  Your appointment is on 07/28/2017 at 12:00 noon. Please arrive at 11:30am for a chest xray at [facility] which is  located on the first floor of our building.  Contact information: [address] [place] [address] [phone]        nursing appointment Follow up.   Why:  Your suture removal appointment is on 07/09/2017 at 10:30am.  Contact information: Dr. Leonarda Salon office          Signed: Elgie Collard 07/02/2017, 2:11 PM

## 2017-07-02 NOTE — Discharge Instructions (Signed)
Video-Assisted Thoracic Surgery, Care After ° °This sheet gives you information about how to care for yourself after your procedure. Your health care provider may also give you more specific instructions. If you have problems or questions, contact your health care provider. °What can I expect after the procedure? °After the procedure, it is common to have: °· Some pain and soreness in your chest. °· Pain when breathing in (inhaling) and coughing. °· Constipation. °· Fatigue. °· Difficulty sleeping. ° °Follow these instructions at home: °Preventing pneumonia °· Take deep breaths or do breathing exercises as instructed by your health care provider. Doing this helps prevent lung infection (pneumonia). °· Cough frequently. Coughing may cause discomfort, but it is important to clear mucus (phlegm) and expand your lungs. If it hurts to cough, hold a pillow against your chest or place the palms of both hands on top of the incision (use splinting) when you cough. This may help relieve discomfort. °· If you were given an incentive spirometer, use it as directed. An incentive spirometer is a tool that measures how well you are filling your lungs with each breath. °· Participate in pulmonary rehabilitation as directed by your health care provider. This is a program that combines education, exercise, and support from a team of specialists. The goal is to help you heal and get back to your normal activities as soon as possible. °Medicines °· Take over-the-counter or prescription medicines only as told by your health care provider. °· If you have pain, take pain-relieving medicine before your pain becomes severe. This is important because if your pain is under control, you will be able to breathe and cough more comfortably. °· If you were prescribed an antibiotic medicine, take it as told by your health care provider. Do not stop taking the antibiotic even if you start to feel better. °Activity °· Ask your health care provider  what activities are safe for you. °· Avoid activities that use your chest muscles for at least 3-4 weeks. °· Do not lift anything that is heavier than 10 lb (4.5 kg), or the limit that your health care provider tells you, until he or she says that it is safe. °Incision care °· Follow instructions from your health care provider about how to take care of your incision(s). Make sure you: °? Wash your hands with soap and water before you change your bandage (dressing). If soap and water are not available, use hand sanitizer. °? Change your dressing as told by your health care provider. °? Leave stitches (sutures), skin glue, or adhesive strips in place. These skin closures may need to stay in place for 2 weeks or longer. If adhesive strip edges start to loosen and curl up, you may trim the loose edges. Do not remove adhesive strips completely unless your health care provider tells you to do that. °· Keep your dressing dry until it has been removed. °· Check your incision area every day for signs of infection. Check for: °? Redness, swelling, or pain. °? Fluid or blood. °? Warmth. °? Pus or a bad smell. °Bathing °· Do not take baths, swim, or use a hot tub until your health care provider approves. You may take showers. °· After your dressing has been removed, use soap and water to gently wash your incision area. Do not use anything else to clean your incision(s) unless your health care provider tells you to do this. °Driving °· Do not drive until your health care provider approves. °· Do not drive   or use heavy machinery while taking prescription pain medicine. °Eating and drinking °· Eat a healthy, balanced diet as instructed by your health care provider. A healthy diet includes plenty of fresh fruits and vegetables, whole grains, and low-fat (lean) proteins. °· Limit foods that are high in fat and processed sugars, such as fried and sweet foods. °· Drink enough fluid to keep your urine clear or pale yellow. °General  instructions °· To prevent or treat constipation while you are taking prescription pain medicine, your health care provider may recommend that you: °? Take over-the-counter or prescription medicines. °? Eat foods that are high in fiber, such as beans, fresh fruits and vegetables, and whole grains. °· Do not use any products that contain nicotine or tobacco, such as cigarettes and e-cigarettes. If you need help quitting, ask your health care provider. °· Avoid secondhand smoke. °· Wear compression stockings as told by your health care provider. These stockings help to prevent blood clots and reduce swelling in your legs. °· If you have a chest tube, care for it as instructed by your health care provider. Do not travel by airplane during the 2 weeks after your chest tube is removed, or until your health care provider says that this is safe. °· Keep all follow-up visits as told by your health care provider. This is important. °Contact a health care provider if: °· You have redness, swelling, or pain around an incision. °· You have fluid or blood coming from an incision. °· Your incision area feels warm to the touch. °· You have pus or a bad smell coming from an incision. °· You have a fever or chills. °· You have nausea or vomiting. °· You have pain that does not get better with medicine. °Get help right away if: °· You have chest pain. °· Your heart is fluttering or beating rapidly. °· You develop a rash. °· You have shortness of breath or trouble breathing. °· You are confused. °· You have trouble speaking. °· You feel weak, light-headed, or dizzy. °· You faint. °Summary °· To help prevent lung infection (pneumonia), take deep breaths or do breathing exercises as instructed by your health care provider. °· Cough frequently to clear mucus (phlegm) and expand your lungs. If it hurts to cough, hold a pillow against your chest or place the palms of both hands on top of the incision (use splinting) when you cough. °· If  you have pain, take pain-relieving medicine before your pain becomes severe. This is important because if your pain is under control, you will be able to breathe and cough more comfortably. °· Ask your health care provider what activities are safe for you. °This information is not intended to replace advice given to you by your health care provider. Make sure you discuss any questions you have with your health care provider. °Document Released: 04/11/2013 Document Revised: 11/24/2016 Document Reviewed: 11/24/2016 °Elsevier Interactive Patient Education © 2017 [employer] ° °

## 2017-07-02 NOTE — Evaluation (Signed)
Physical Therapy Evaluation Patient Details Name: Katrina Hunter MRN: [id number] DOB: [birth year] Today's Date: 07/02/2017   History of Present Illness  Pt is a 76 yo female with carcinoma nodule in lung LLL s/p L lung lower lobe wedge resection. 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    Clinical Impression  Patient is s/p above surgery resulting in functional limitations due to the deficits listed below (see PT Problem List). Pt is mod I for bed mobility and min guard for transfers, minA for ambulation of 250 feet due to 1xLoB, and min guard for ascent/descent of 12 stairs. Pt is currently limited by fatigue and weakness. Pt educated on need to use RW when she first gets home in case she does experience and minor LoB. Patient will benefit from skilled PT to increase their independence and safety with mobility to allow discharge to the venue listed below.       Follow Up Recommendations No PT follow up;Supervision/Assistance - 24 hour    Equipment Recommendations  Other (comment) (pt to get RW from church if not possible suggest RW at d/c)    Recommendations for Other Services       Precautions / Restrictions Precautions Precautions: None Restrictions Weight Bearing Restrictions: No      Mobility  Bed Mobility Overal bed mobility: Modified Independent                Transfers Overall transfer level: Needs assistance Equipment used: None Transfers: Sit to/from Stand Sit to Stand: Min guard         General transfer comment: min guard for safety good power up and safe descent to bed  Ambulation/Gait Ambulation/Gait assistance: Min assist Ambulation Distance (Feet): 250 Feet Assistive device: None Gait Pattern/deviations: Step-through pattern;Decreased stride length Gait velocity: slowed Gait velocity interpretation: Below normal speed for 76/gender General Gait Details: min guard for safety, minA needed to steady for 1xLoB,  pt with slow, steady gait initially, after ascent/descent of stairs became fatigued and gait began to drift right and left. Pt educated on need to use RW for a few days while she regains her strength so that she will have something to steady herself with if she has a LoB   Stairs Stairs: Yes Stairs assistance: Min guard Stair Management: One rail Left;Step to pattern;Forwards Number of Stairs: 12 General stair comments: slow, steady ascent/descent 2/4 DoE by end on stair training recovered quickly      Balance Overall balance assessment: No apparent balance deficits (not formally assessed)                                           Pertinent Vitals/Pain Pain Assessment: No/denies pain    Home Living Family/patient expects to be discharged to:: Private residence Living Arrangements: Spouse/significant other Available Help at Discharge: Family;Available 24 hours/day Type of Home: House Home Access: Level entry     Home Layout: Two level;Able to live on main level with bedroom/bathroom Home Equipment: Grab bars - tub/shower      Prior Function Level of Independence: Independent         Comments: community ambulator and driver        Extremity/Trunk Assessment   Upper Extremity Assessment Upper Extremity Assessment: Overall WFL for tasks assessed    Lower Extremity Assessment Lower Extremity Assessment: Overall WFL for tasks assessed;Generalized weakness  Cervical / Trunk Assessment Cervical / Trunk Assessment: Normal  Communication   Communication: No difficulties  Cognition Arousal/Alertness: Awake/alert Behavior During Therapy: WFL for tasks assessed/performed Overall Cognitive Status: Within Functional Limits for tasks assessed                                        General Comments General comments (skin integrity, edema, etc.): at rest in supine BP 120/37, HR 89 bpm, SaO2 on RA 93% O2, after activity, BP 143/84, HR 90  bpm, SaO2 on RA 95% O2        Assessment/Plan    PT Assessment Patient needs continued PT services  PT Problem List Decreased activity tolerance;Cardiopulmonary status limiting activity       PT Treatment Interventions DME instruction;Gait training;Stair training;Functional mobility training;Therapeutic activities;Therapeutic exercise;Balance training;Patient/family education    PT Goals (Current goals can be found in the Care Plan section)  Acute Rehab PT Goals Patient Stated Goal: get home to her dog PT Goal Formulation: With patient Time For Goal Achievement: 07/09/17 Potential to Achieve Goals: Good    Frequency Min 3X/week    AM-PAC PT "6 Clicks" Daily Activity  Outcome Measure Difficulty turning over in bed (including adjusting bedclothes, sheets and blankets)?: A Lot Difficulty moving from lying on back to sitting on the side of the bed? : A Lot Difficulty sitting down on and standing up from a chair with arms (e.g., wheelchair, bedside commode, etc,.)?: A Little Help needed moving to and from a bed to chair (including a wheelchair)?: None Help needed walking in hospital room?: None Help needed climbing 3-5 steps with a railing? : None 6 Click Score: 19    End of Session Equipment Utilized During Treatment: Gait belt Activity Tolerance: Patient limited by fatigue Patient left: in bed;with call bell/phone within reach;with family/visitor present Nurse Communication: Mobility status PT Visit Diagnosis: Unsteadiness on feet (R26.81);Other abnormalities of gait and mobility (R26.89)    Time: 4098-1191 PT Time Calculation (min) (ACUTE ONLY): 33 min   Charges:   PT Evaluation $PT Eval Moderate Complexity: 1 Procedure PT Treatments $Gait Training: 8-22 mins   PT G Codes:        Marty Sadlowski B. Migdalia Dk PT, DPT Acute Rehabilitation  [phone] Pager [phone]    [place] 07/02/2017, 11:08 AM

## 2017-07-09 ENCOUNTER — Encounter (INDEPENDENT_AMBULATORY_CARE_PROVIDER_SITE_OTHER): Payer: Self-pay

## 2017-07-09 DIAGNOSIS — Z4802 Encounter for removal of sutures: Secondary | ICD-10-CM

## 2017-07-27 ENCOUNTER — Other Ambulatory Visit: Payer: Self-pay | Admitting: Thoracic Surgery (Cardiothoracic Vascular Surgery)

## 2017-07-27 DIAGNOSIS — C3490 Malignant neoplasm of unspecified part of unspecified bronchus or lung: Secondary | ICD-10-CM

## 2017-07-28 ENCOUNTER — Encounter: Payer: Self-pay | Admitting: Thoracic Surgery (Cardiothoracic Vascular Surgery)

## 2017-07-28 ENCOUNTER — Ambulatory Visit
Admission: RE | Admit: 2017-07-28 | Discharge: 2017-07-28 | Disposition: A | Discharge status: Home / Self Care | Payer: Medicare Other | Source: Ambulatory Visit | Attending: Thoracic Surgery (Cardiothoracic Vascular Surgery) | Admitting: Thoracic Surgery (Cardiothoracic Vascular Surgery)

## 2017-07-28 ENCOUNTER — Ambulatory Visit (INDEPENDENT_AMBULATORY_CARE_PROVIDER_SITE_OTHER): Payer: Self-pay | Admitting: Thoracic Surgery (Cardiothoracic Vascular Surgery)

## 2017-07-28 VITALS — BP 130/82 | HR 83 | Resp 20 | Ht 64.0 in | Wt 140.0 lb

## 2017-07-28 DIAGNOSIS — C3432 Malignant neoplasm of lower lobe, left bronchus or lung: Secondary | ICD-10-CM

## 2017-07-28 DIAGNOSIS — C3490 Malignant neoplasm of unspecified part of unspecified bronchus or lung: Secondary | ICD-10-CM

## 2017-07-28 DIAGNOSIS — Z09 Encounter for follow-up examination after completed treatment for conditions other than malignant neoplasm: Secondary | ICD-10-CM

## 2017-07-28 MED ORDER — BENZONATATE 100 MG PO CAPS: 100.0000 mg | ORAL_CAPSULE | Freq: Three times a day (TID) | ORAL | 2 refills | Status: DC | PRN

## 2017-07-28 NOTE — Progress Notes (Signed)
[address]       [place] [address]             [phone]    HPI: Mrs. Tate returns for a scheduled postoperative follow-up visit  She is a 76 year old lifelong nonsmoker who was found to have a lung nodule on a chest x-ray prior to having a pacemaker placed. A navigational bronchoscopy in 2014 was negative. Continued follow-up showed slow progressive growth of the lesion. A PET CT showed the lesion was mildly hypermetabolic with SUV of 1.7. I did a wedge resection and node dissection on 06/29/2017. Her postoperative course was uncomplicated and she went home on day 3.  She does have some mild incisional pain. She is taking extra strength Tylenol for that. She is not requiring narcotics. She is most bothered by a persistent nonproductive cough. Otherwise she feels well.  Past Medical History:  Diagnosis Date  . Aneurysm ([place])    aortic   . AV block, complete ([place])    PPM placed 07/2011 [facility]  . Headache    migraines  . Heart murmur   . History of prolapse of bladder   . HLD (hyperlipidemia)   . HTN (hypertension)   . Hypothyroidism     no rx  . Pacemaker 12  . Pulmonary nodule, left 04/22/2017   From order notes     Current Outpatient Prescriptions  Medication Sig Dispense Refill  . amLODipine-benazepril (LOTREL) 10-20 MG per capsule Take 1 capsule by mouth daily.      Marland Kitchen azelastine (ASTELIN) 137 MCG/SPRAY nasal spray Place 1 spray into both nostrils daily as needed for allergies.     . cetirizine (ZYRTEC) 10 MG tablet Take 10 mg by mouth daily. Pt takes PRN    . cholecalciferol (VITAMIN D) 1000 UNITS tablet Take 2,000 Units by mouth daily.     . Cyanocobalamin (B-12) 3000 MCG CAPS Take 3,000 mcg by mouth daily.    . pravastatin (PRAVACHOL) 20 MG tablet Take 20 mg by mouth at bedtime.     . traMADol (ULTRAM) 50 MG tablet Take 1 tablet (50 mg total) by mouth every 6 (six) hours as needed for moderate pain. 30 tablet 0  .  triamterene-hydrochlorothiazide (MAXZIDE-25) 37.5-25 MG per tablet Take 1 tablet by mouth daily.      . benzonatate (TESSALON PERLES) 100 MG capsule Take 1 capsule (100 mg total) by mouth 3 (three) times daily as needed for cough. 20 capsule 2   No current facility-administered medications for this visit.     Physical Exam BP 130/82   Pulse 83   Resp 20   Ht 5\' 4"  (1.626 m)   Wt 140 lb (63.5 kg)   SpO2 95% Comment: RA  BMI 24.7 kg/m  76 year old woman in no acute distress Alert and oriented 3 with no focal deficits Incisions clean dry and intact Lungs clear with breath sounds bilaterally Cardiac regular rate and rhythm normal S1 and S2 No peripheral edema  Diagnostic Tests: CHEST  2 VIEW  COMPARISON:  Chest x-ray of July 02, 2017  FINDINGS: The right lung is well-expanded and clear. On the left there is persistent blunting of the lateral costophrenic angle. There is an improved visualization of the left hemidiaphragm. There is no discrete infiltrate. There remains some increased density in the retrocardiac region. The heart and pulmonary vascularity are normal. There is calcification in the wall of the aortic arch. The ICD is in stable position.  IMPRESSION:  Interval resolution of right basilar atelectasis and improved appearance of the left lung base with decreased atelectasis and pleural fluid. There is persistent ovoid increased density projecting over the left heart border on the frontal view only that will merit follow-up.  No CHF.  Thoracic aortic atherosclerosis.   Electronically Signed   By: David  Martinique M.D.   On: 07/28/2017 11:42 I personally reviewed the chest x-ray and concur with the findings noted above. I suspect the ovoid density is some fluid in the area of the fissure where we attempted to dissect.  Impression: Mrs. Constantin is a 76 year old nonsmoker who had a slowly growing lung nodule that was mildly hypermetabolic on PET/CT. 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 She was treated with a wedge resection and a node dissection. Given the very slow growth and very mild activity in the lesion I did not feel lobectomy was warranted.  She is doing well postoperatively. She does have some discomfort but is able to manage that with Tylenol. Her biggest complaint is a persistent cough. She has not tried taking any cough medication. I explained that is usually just irritation from the surgery and eventually will resolve with time. In the meantime we'll give her a prescription for Tessalon 100 mg by mouth 3 times a day when necessary to use for cough. She can try that and see if that helps.  Ascending aneurysm-  4.5 cm on most recent CT. It has been stable over time as the lung nodule was followed. Needs continued follow-up every 6 months.  Plan: Will refer to multidisciplinary thoracic oncology clinic for oncology consult.  Return in 2 months with PA and lateral chest x-ray  Melrose Nakayama, MD [facility] [phone]

## 2017-07-31 ENCOUNTER — Telehealth: Payer: Self-pay | Admitting: *Deleted

## 2017-07-31 ENCOUNTER — Encounter: Payer: Self-pay | Admitting: Oncology

## 2017-07-31 NOTE — Telephone Encounter (Signed)
Oncology Nurse Navigator Documentation  Oncology Nurse Navigator Flowsheets 07/31/2017  Navigator Location [facility]  Referral date to RadOnc/MedOnc 07/28/2017  Navigator Encounter Type Telephone/I received referral on Katrina Hunter.  I called to schedule but I was unable to reach.  I did leave vm message to call.  I also left my name and phone number.   Telephone Outgoing Call  Abnormal Finding Date 01/28/2017  Confirmed Diagnosis Date 06/29/2017  Surgery Date 06/29/2017  Treatment Initiated Date 06/29/2017  Treatment Phase Other  Barriers/Navigation Needs Coordination of Care  Interventions Coordination of Care  Coordination of Care Appts  Acuity Level 1  Time Spent with Patient 15

## 2017-08-06 ENCOUNTER — Other Ambulatory Visit (HOSPITAL_BASED_OUTPATIENT_CLINIC_OR_DEPARTMENT_OTHER): Payer: Medicare Other

## 2017-08-06 ENCOUNTER — Ambulatory Visit (HOSPITAL_BASED_OUTPATIENT_CLINIC_OR_DEPARTMENT_OTHER): Payer: Medicare Other | Admitting: Internal Medicine

## 2017-08-06 ENCOUNTER — Ambulatory Visit: Payer: Medicare Other | Attending: Internal Medicine | Admitting: Physical Therapy

## 2017-08-06 ENCOUNTER — Other Ambulatory Visit: Payer: Self-pay | Admitting: Medical Oncology

## 2017-08-06 ENCOUNTER — Encounter: Payer: Self-pay | Admitting: Internal Medicine

## 2017-08-06 VITALS — BP 135/68 | HR 82 | Temp 98.0°F | Resp 17 | Ht 64.0 in | Wt 140.7 lb

## 2017-08-06 DIAGNOSIS — C3490 Malignant neoplasm of unspecified part of unspecified bronchus or lung: Secondary | ICD-10-CM

## 2017-08-06 DIAGNOSIS — C3432 Malignant neoplasm of lower lobe, left bronchus or lung: Secondary | ICD-10-CM

## 2017-08-06 DIAGNOSIS — C3492 Malignant neoplasm of unspecified part of left bronchus or lung: Secondary | ICD-10-CM

## 2017-08-06 DIAGNOSIS — I10 Essential (primary) hypertension: Secondary | ICD-10-CM

## 2017-08-06 DIAGNOSIS — Z483 Aftercare following surgery for neoplasm: Secondary | ICD-10-CM

## 2017-08-06 LAB — CBC WITH DIFFERENTIAL/PLATELET
BASO%: 0.9 % (ref 0.0–2.0)
Basophils Absolute: 0 10*3/uL (ref 0.0–0.1)
EOS%: 1.7 % (ref 0.0–7.0)
Eosinophils Absolute: 0.1 10*3/uL (ref 0.0–0.5)
HEMATOCRIT: 38.1 % (ref 34.8–46.6)
HEMOGLOBIN: 12.4 g/dL (ref 11.6–15.9)
LYMPH#: 1.3 10*3/uL (ref 0.9–3.3)
LYMPH%: 28.5 % (ref 14.0–49.7)
MCH: 28.4 pg (ref 25.1–34.0)
MCHC: 32.5 g/dL (ref 31.5–36.0)
MCV: 87.2 fL (ref 79.5–101.0)
MONO#: 0.5 10*3/uL (ref 0.1–0.9)
MONO%: 11.5 % (ref 0.0–14.0)
NEUT#: 2.6 10*3/uL (ref 1.5–6.5)
NEUT%: 57.4 % (ref 38.4–76.8)
PLATELETS: 270 10*3/uL (ref 145–400)
RBC: 4.37 10*6/uL (ref 3.70–5.45)
RDW: 14.3 % (ref 11.2–14.5)
WBC: 4.6 10*3/uL (ref 3.9–10.3)

## 2017-08-06 LAB — COMPREHENSIVE METABOLIC PANEL
ALBUMIN: 3.7 g/dL (ref 3.5–5.0)
ALK PHOS: 237 U/L — AB (ref 40–150)
ALT: 21 U/L (ref 0–55)
ANION GAP: 9 meq/L (ref 3–11)
AST: 26 U/L (ref 5–34)
BUN: 21.4 mg/dL (ref 7.0–26.0)
CALCIUM: 10.4 mg/dL (ref 8.4–10.4)
CHLORIDE: 102 meq/L (ref 98–109)
CO2: 30 mEq/L — ABNORMAL HIGH (ref 22–29)
Creatinine: 1.3 mg/dL — ABNORMAL HIGH (ref 0.6–1.1)
EGFR: 40 mL/min/{1.73_m2} — ABNORMAL LOW (ref >90)
Glucose: 98 mg/dl (ref 70–140)
POTASSIUM: 3.6 meq/L (ref 3.5–5.1)
Sodium: 141 mEq/L (ref 136–145)
Total Bilirubin: 0.65 mg/dL (ref 0.20–1.20)
Total Protein: 7.8 g/dL (ref 6.4–8.3)

## 2017-08-06 NOTE — Therapy (Signed)
[place], [place], [address] Phone: [phone]   Fax:  [phone]  Physical Therapy Evaluation  Patient Details  Name: Katrina Hunter MRN: [id number] Date of Birth: 24-May-1941 Referring Provider: Dr. Curt Bears  Encounter Date: 08/06/2017      PT End of Session - 08/06/17 1713    Visit Number 1   Number of Visits 1   PT Start Time 1648   PT Stop Time 1704   PT Time Calculation (min) 16 min   Activity Tolerance Patient tolerated treatment well   Behavior During Therapy [facility] for tasks assessed/performed      Past Medical History:  Diagnosis Date  . 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 AV block, complete ([place])    PPM placed 07/2011 [facility]  . Headache    migraines  . Heart murmur   . History of prolapse of bladder   . HLD (hyperlipidemia)   . HTN (hypertension)   . Hypothyroidism     no rx  . Pacemaker 12  . Pulmonary nodule, left 04/22/2017   From order notes    Past Surgical History:  Procedure Laterality Date  . BLADDER SURGERY  01/03/2017  . LYMPH NODE DISSECTION Left 06/29/2017   Procedure: LYMPH NODE DISSECTION LEFT LUNG;  Surgeon: Melrose Nakayama, MD;  Location: [place];  Service: Thoracic;  Laterality: Left;  . PACEMAKER INSERTION  07/2011   [name]  . TONSILLECTOMY  age 39  . TUBAL LIGATION    . VIDEO ASSISTED THORACOSCOPY (VATS)/WEDGE RESECTION Left 06/29/2017   Procedure: VIDEO ASSISTED THORACOSCOPY (VATS)/WEDGE RESECTION;  Surgeon: Melrose Nakayama, MD;  Location: [place];  Service: Thoracic;  Laterality: Left;  Marland Kitchen VIDEO BRONCHOSCOPY WITH ENDOBRONCHIAL NAVIGATION N/A 12/05/2013   Procedure: VIDEO BRONCHOSCOPY WITH ENDOBRONCHIAL NAVIGATION;  Surgeon: Collene Gobble, MD;  [facility];  Service: Thoracic;  Laterality: N/A;    There were no vitals filed for this visit.       Subjective Assessment - 08/06/17 1704    Subjective "I just had my surgery five weeks ago.  My  surgeon said I can do my regular activity, I just might not be able to do what I used to do."   Pertinent History Incidental finding of lung nodule when having pre-op chest x-ray for pacemaker placement.  VATS wedge resection of left lower lobe nodule area 06/29/17. Never smoker.  HTN, aortic aneurysm.   Patient Stated Goals get info from all lung clinic providers   Currently in Pain? No/denies            [name] PT Assessment - 08/06/17 0001      Assessment   Medical Diagnosis left lower lobe mucinous adenocarcinoma, stage I   Referring Provider Dr. Curt Bears   Onset Date/Surgical Date 06/29/17   Prior Therapy none other than inpatient hospital PT assessment     Precautions   Precautions Other (comment)   Precaution Comments cancer precautions     Restrictions   Weight Bearing Restrictions No     Balance Screen   Has the patient fallen in the past 6 months No   Has the patient had a decrease in activity level because of a fear of falling?  No   Is the patient reluctant to leave their home because of a fear of falling?  No     Home Social worker Private residence   Living Arrangements Spouse/significant other   Type  of Home House   Home Layout Multi-level     Prior Function   Level of Independence Independent   Vocation Retired   Leisure no current exercise since her surgery; she used to walk her dog about 30 minutes 3 days a week     Cognition   Overall Cognitive Status Within Functional Limits for tasks assessed     Observation/Other Assessments   Observations cheerful older woman in no distress     Coordination   Gross Motor Movements are Fluid and Coordinated Yes     Functional Tests   Functional tests Sit to Stand     Sit to Stand   Comments 12 times in 30 seconds, above average for age  mild-moderate dyspnea following     Posture/Postural Control   Posture/Postural Control No significant limitations     ROM / Strength   AROM /  PROM / Strength AROM     AROM   Overall AROM Comments standing trunk AROM WFL for flexion, 10% limited other directions     Ambulation/Gait   Ambulation/Gait Yes   Ambulation/Gait Assistance 7: Independent     Balance   Balance Assessed Yes     Dynamic Standing Balance   Dynamic Standing - Comments reaches forward approx. 12 inches in standing            Objective measurements completed on examination: See above findings.                  PT Education - 08/06/17 1712    Education provided Yes   Education Details walking, CURE article on benefits of staying active, posture, breathing, PT info   Person(s) Educated Patient   Methods Explanation;Handout   Comprehension Verbalized understanding               Lung Clinic Goals - 08/06/17 1716      Patient will be able to verbalize understanding of the benefit of exercise to decrease fatigue.   Status Achieved     Patient will be able to verbalize the importance of posture.   Status Achieved     Patient will be able to demonstrate diaphragmatic breathing for improved lung function.   Status Achieved     Patient will be able to verbalize understanding of the role of physical therapy to prevent functional decline and who to contact if physical therapy is needed.   Status Achieved              Plan - 08/06/17 1713    Clinical Impression Statement This is a pleasant 76 year-old woman who is s/p wedge resection for lung cancer, stage I.  She will be followed with observation.  Her mobility is good, though activity level currently below normal for her as she is still recovering from her surgery five weeks ago.  She did have mild-moderate dyspnea with 30 second sit to stand test.   History and Personal Factors relevant to plan of care: none   Clinical Presentation Stable   Clinical Decision Making Low   Rehab Potential Excellent   PT Frequency One time visit   PT Treatment/Interventions  Patient/family education   PT Next Visit Plan None at this time.   PT Home Exercise Plan walking program, diaphragmatic breathing    Consulted and Agree with Plan of Care Patient      Patient will benefit from skilled therapeutic intervention in order to improve the following deficits and impairments:  Cardiopulmonary status limiting activity, Decreased activity  tolerance  Visit Diagnosis: Aftercare following surgery for neoplasm      G-Codes - 08-27-17 1716    Functional Assessment Tool Used (Outpatient Only) clinical judgement   Functional Limitation Mobility: Walking and moving around   Mobility: Walking and Moving Around Current Status [phone]) At least 1 percent but less than 20 percent impaired, limited or restricted   Mobility: Walking and Moving Around Goal Status [phone]) At least 1 percent but less than 20 percent impaired, limited or restricted   Mobility: Walking and Moving Around Discharge Status [phone]) At least 1 percent but less than 20 percent impaired, limited or restricted       Problem List Patient Active Problem List   Diagnosis Date Noted  . Lung cancer ([place]) 06/29/2017  . Pulmonary nodule, left 08/19/2011  . AV block, complete ([facility])   . HTN (hypertension)   . HLD (hyperlipidemia)   . Hypothyroidism   . Complete heart block ([place]) 2011/08/28  . Hypertension 08-28-2011    Samariah Hokenson 08/27/17, 5:18 PM  [place], [place], [address] Phone: [phone]   Fax:  [phone]  Name: KINNEDY MONGIELLO MRN: [id number] Date of Birth: 12-May-1941  Serafina Royals, PT 2017/08/27 5:18 PM

## 2017-08-06 NOTE — Progress Notes (Signed)
[place] [phone]   [phone] Multidisciplinary thoracic oncology clinic  CONSULT NOTE  REFERRING PHYSICIAN: Dr. Modesto Charon.  REASON FOR CONSULTATION:  76 years old white female recently diagnosed with lung cancer.  HPI Katrina Hunter is a 76 y.o. female a never smoker with past medical history significant for aortic aneurysm, AV block, heart murmur, hypertension, hypothyroidism, status post pacemaker placement, history of prolapsed bladder as well as dyslipidemia. The patient mentions that in 2012 she had preoperative CT scan of the chest performed on 08/04/2011 for pacemaker placement and it showed 1.2 x 1.0 cm pulmonary nodule in the left lower lobe suspicious for bronchogenic carcinoma. A PET scan on 09/04/2011 showed 1.1 cm groundglass opacity in the left lower lobe non-FDG avid with maximum SUV of 1.6. And this was a sterile suspicious for low-grade adenocarcinoma. There was no suspicious hypermetabolic foci in the neck, chest, abdomen or pelvis. On 12/05/2013 the patient underwent flexible video fiberoptic bronchoscopy with electromagnetic navigation and biopsies under the care of Dr. West Carbo but this was nondiagnostic. She was followed by regular imaging studies and CT scan of the chest on an annual basis. CT scan of the chest on 01/28/2017 showed 1.6 cm spiculated pulmonary nodule in the left lower lobe mildly increased in size compared to the previous studies and this was highly suspicious for bronchogenic carcinoma. Repeat PET scan on 04/22/2017 showed ill-defined spiculated 1.5 by 1.6 cm left lower lobe pulmonary nodule with maximum SUV of 1.7. The patient was referred to Dr. Roxan Hockey. On 06/29/2017 she underwent left VATS with wedge resection of the left lower lobe with mediastinal lymph node dissection. The final pathology ([id number]) showed mucinous adenocarcinoma of the lung measuring 1.7 cm resection margins were negative for  mucinous adenocarcinoma. There was a carcinoid tumorlet measuring 0.2 cm at the resection margin. The dissected lymph nodes were negative for malignancy. There was no evidence for visceral pleural invasion or lymphovascular invasion. The patient is recovering well from her surgery and Dr. Roxan Hockey kindly referred her to the multidisciplinary thoracic oncology clinic today for evaluation and further management. When seen today she is feeling fine except for lack of energy. She denied having any current chest pain, shortness breath, cough or hemoptysis. She denied having any weight loss or night sweats. She has no nausea, vomiting, diarrhea or constipation. She has no headache or visual changes. Family history significant for mother with breast cancer and father had heart disease. The patient is married and has 3 children. She was accompanied today by her husband Katrina Hunter. She is a retired Customer service manager. She has no history of smoking, alcohol or drug abuse.   HPI  Past Medical History:  Diagnosis Date  . 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 AV block, complete ([place])    PPM placed 07/2011 [facility]  . Headache    migraines  . Heart murmur   . History of prolapse of bladder   . HLD (hyperlipidemia)   . HTN (hypertension)   . Hypothyroidism     no rx  . Pacemaker 12  . Pulmonary nodule, left 04/22/2017   From order notes    Past Surgical History:  Procedure Laterality Date  . BLADDER SURGERY  01/03/2017  . LYMPH NODE DISSECTION Left 06/29/2017   Procedure: LYMPH NODE DISSECTION LEFT LUNG;  Surgeon: Melrose Nakayama, MD;  Location: [place];  Service: Thoracic;  Laterality: Left;  . PACEMAKER INSERTION  07/2011   [name]  . TONSILLECTOMY  age 82  . TUBAL LIGATION    . VIDEO ASSISTED THORACOSCOPY (VATS)/WEDGE RESECTION Left 06/29/2017   Procedure: VIDEO ASSISTED THORACOSCOPY (VATS)/WEDGE RESECTION;  Surgeon: Melrose Nakayama, MD;  Location: [place];  Service: Thoracic;  Laterality: Left;  Marland Kitchen VIDEO  BRONCHOSCOPY WITH ENDOBRONCHIAL NAVIGATION N/A 12/05/2013   Procedure: VIDEO BRONCHOSCOPY WITH ENDOBRONCHIAL NAVIGATION;  Surgeon: Collene Gobble, MD;  [facility];  Service: Thoracic;  Laterality: N/A;    Family History  Problem Relation Age of Onset  . Heart disease Father   . Breast cancer Mother 3    Social History Social History  Substance Use Topics  . Smoking status: Never Smoker  . Smokeless tobacco: Never Used  . Alcohol use No    Allergies  Allergen Reactions  . Crestor [Rosuvastatin Calcium] Other (See Comments)    myalgia  . Sulfonamide Derivatives Rash    Current Outpatient Prescriptions  Medication Sig Dispense Refill  . amLODipine-benazepril (LOTREL) 10-20 MG per capsule Take 1 capsule by mouth daily.      Marland Kitchen azelastine (ASTELIN) 137 MCG/SPRAY nasal spray Place 1 spray into both nostrils daily as needed for allergies.     . benzonatate (TESSALON PERLES) 100 MG capsule Take 1 capsule (100 mg total) by mouth 3 (three) times daily as needed for cough. 20 capsule 2  . cetirizine (ZYRTEC) 10 MG tablet Take 10 mg by mouth daily. Pt takes PRN    . cholecalciferol (VITAMIN D) 1000 UNITS tablet Take 2,000 Units by mouth daily.     . Cyanocobalamin (B-12) 3000 MCG CAPS Take 3,000 mcg by mouth daily.    . pravastatin (PRAVACHOL) 20 MG tablet Take 20 mg by mouth at bedtime.     . traMADol (ULTRAM) 50 MG tablet Take 1 tablet (50 mg total) by mouth every 6 (six) hours as needed for moderate pain. 30 tablet 0  . triamterene-hydrochlorothiazide (MAXZIDE-25) 37.5-25 MG per tablet Take 1 tablet by mouth daily.       No current facility-administered medications for this visit.     Review of Systems  Constitutional: positive for fatigue Eyes: negative Ears, nose, mouth, throat, and face: negative Respiratory: negative Cardiovascular: negative Gastrointestinal: negative Genitourinary:negative Integument/breast: negative Hematologic/lymphatic:  negative Musculoskeletal:negative Neurological: negative Behavioral/Psych: negative Endocrine: negative Allergic/Immunologic: negative  Physical Exam  ZDG:LOVFI, healthy, no distress, well nourished, well developed and anxious SKIN: skin color, texture, turgor are normal, no rashes or significant lesions HEAD: Normocephalic, No masses, lesions, tenderness or abnormalities EYES: normal, PERRLA, Conjunctiva are pink and non-injected EARS: External ears normal, Canals clear OROPHARYNX:no exudate, no erythema and lips, buccal mucosa, and tongue normal  NECK: supple, no adenopathy, no JVD LYMPH:  no palpable lymphadenopathy, no hepatosplenomegaly BREAST:not examined LUNGS: clear to auscultation , and palpation HEART: regular rate & rhythm, no murmurs and no gallops ABDOMEN:abdomen soft, non-tender, normal bowel sounds and no masses or organomegaly BACK: Back symmetric, no curvature., No CVA tenderness EXTREMITIES:no joint deformities, effusion, or inflammation, no edema, no skin discoloration  NEURO: alert & oriented x 3 with fluent speech, no focal motor/sensory deficits  PERFORMANCE STATUS: ECOG 1  LABORATORY DATA: Lab Results  Component Value Date   WBC 4.6 08/06/2017   HGB 12.4 08/06/2017   HCT 38.1 08/06/2017   MCV 87.2 08/06/2017   PLT 270 08/06/2017      Chemistry      Component Value Date/Time   NA 141 08/06/2017 1507   K 3.6 08/06/2017 1507   CL 104 07/01/2017 0210  CO2 30 (H) 08/06/2017 1507   BUN 21.4 08/06/2017 1507   CREATININE 1.3 (H) 08/06/2017 1507      Component Value Date/Time   CALCIUM 10.4 08/06/2017 1507   ALKPHOS 237 (H) 08/06/2017 1507   AST 26 08/06/2017 1507   ALT 21 08/06/2017 1507   BILITOT 0.65 08/06/2017 1507       RADIOGRAPHIC STUDIES: Dg Chest 2 View  Result Date: 07/28/2017 CLINICAL DATA:  Left-sided thoracic surgery on June 29, 2017 for lung malignancy. Follow-up study. EXAM: CHEST  2 VIEW COMPARISON:  Chest x-ray of July 02, 2017  FINDINGS: The right lung is well-expanded and clear. On the left there is persistent blunting of the lateral costophrenic angle. There is an improved visualization of the left hemidiaphragm. There is no discrete infiltrate. There remains some increased density in the retrocardiac region. The heart and pulmonary vascularity are normal. There is calcification in the wall of the aortic arch. The ICD is in stable position. IMPRESSION: Interval resolution of right basilar atelectasis and improved appearance of the left lung base with decreased atelectasis and pleural fluid. There is persistent ovoid increased density projecting over the left heart border on the frontal view only that will merit follow-up. No CHF. Thoracic aortic atherosclerosis. Electronically Signed   By: David  Martinique M.D.   On: 07/28/2017 11:42    ASSESSMENT:This is a very pleasant 76 years old white female recently diagnosed with a stage Ia (T1b, N0, M0) non-small cell lung cancer, adenocarcinoma diagnosed in July 2018, presented with left lower lobe lung nodule status post left lower lobe wedge resection with lymph node dissection under the care of Dr. Roxan Hockey on 06/29/2017.  PLAN: I had a lengthy discussion with the patient and her husband about her current condition and treatment options. I explained to the patient that she had received curative treatment for her condition with the surgical resection. I also explained to the patient that the 5 year survival is around 80% with her stage of the disease. I also explained to the patient that there is no survival benefit for adjuvant systemic chemotherapy or radiotherapy with her condition. I recommended for her to continue on observation with repeat CT scan of the chest in 6 months. She was advised to call immediately if she has any concerning symptoms in the interval. She was seen during the multidisciplinary thoracic oncology clinic today by medical oncology, thoracic navigator and  physical therapist. The patient was advised to call immediately if she has any concerning symptoms in the interval. The patient voices understanding of current disease status and treatment options and is in agreement with the current care plan. All questions were answered. The patient knows to call the clinic with any problems, questions or concerns. We can certainly see the patient much sooner if necessary.  Thank you so much for allowing me to participate in the care of MARKAN CAZAREZ. I will continue to follow up the patient with you and assist in her care.  I spent 40 minutes counseling the patient face to face. The total time spent in the appointment was 60 minutes.  Disclaimer: This note was dictated with voice recognition software. Similar sounding words can inadvertently be transcribed and may not be corrected upon review.   Haila Dena K. August 06, 2017, 3:49 PM

## 2017-08-08 DIAGNOSIS — C3492 Malignant neoplasm of unspecified part of left bronchus or lung: Secondary | ICD-10-CM | POA: Insufficient documentation

## 2017-08-11 ENCOUNTER — Telehealth: Payer: Self-pay | Admitting: Internal Medicine

## 2017-08-11 NOTE — Telephone Encounter (Signed)
Spoke to patient in regards to 2/11 appointment.

## 2017-08-28 ENCOUNTER — Telehealth: Payer: Self-pay | Admitting: Internal Medicine

## 2017-08-28 NOTE — Telephone Encounter (Signed)
Pt needs to reschedule her 09/02/18 appt with MR d/t scheduling conflict Please advise Daneil Dan where this patient can be worked back in soon. Thanks.

## 2017-09-01 NOTE — Telephone Encounter (Signed)
1st available is fine  Dr. Brand Males, M.D., [facility].C.P Pulmonary and Critical Care Medicine Staff Physician [place] [facility] Pager: [phone], If no answer or between  15:00h - 7:00h: call [phone]  09/01/2017 9:52 AM

## 2017-09-01 NOTE — Telephone Encounter (Signed)
Spoke with pt, states that she does not wish to rescheduled rov at this time- will call back if her s/s change.  Will close encounter.

## 2017-09-02 ENCOUNTER — Ambulatory Visit: Payer: Medicare Other | Admitting: Internal Medicine

## 2017-09-22 ENCOUNTER — Encounter: Payer: Medicare Other | Admitting: Thoracic Surgery (Cardiothoracic Vascular Surgery)

## 2017-09-28 ENCOUNTER — Other Ambulatory Visit: Payer: Self-pay | Admitting: Thoracic Surgery (Cardiothoracic Vascular Surgery)

## 2017-09-28 DIAGNOSIS — C3490 Malignant neoplasm of unspecified part of unspecified bronchus or lung: Secondary | ICD-10-CM

## 2017-09-29 ENCOUNTER — Ambulatory Visit (INDEPENDENT_AMBULATORY_CARE_PROVIDER_SITE_OTHER): Payer: Self-pay | Admitting: Thoracic Surgery (Cardiothoracic Vascular Surgery)

## 2017-09-29 ENCOUNTER — Ambulatory Visit
Admission: RE | Admit: 2017-09-29 | Discharge: 2017-09-29 | Disposition: A | Discharge status: Home / Self Care | Payer: Medicare Other | Source: Ambulatory Visit | Attending: Thoracic Surgery (Cardiothoracic Vascular Surgery) | Admitting: Thoracic Surgery (Cardiothoracic Vascular Surgery)

## 2017-09-29 ENCOUNTER — Encounter: Payer: Self-pay | Admitting: Thoracic Surgery (Cardiothoracic Vascular Surgery)

## 2017-09-29 ENCOUNTER — Other Ambulatory Visit: Payer: Self-pay | Admitting: *Deleted

## 2017-09-29 VITALS — BP 134/81 | HR 73 | Ht 64.0 in | Wt 141.0 lb

## 2017-09-29 DIAGNOSIS — C3490 Malignant neoplasm of unspecified part of unspecified bronchus or lung: Secondary | ICD-10-CM

## 2017-09-29 DIAGNOSIS — I712 Thoracic aortic aneurysm, without rupture, unspecified: Secondary | ICD-10-CM

## 2017-09-29 DIAGNOSIS — Z09 Encounter for follow-up examination after completed treatment for conditions other than malignant neoplasm: Secondary | ICD-10-CM

## 2017-09-29 DIAGNOSIS — I7121 Aneurysm of the ascending aorta, without rupture: Secondary | ICD-10-CM | POA: Insufficient documentation

## 2017-09-29 DIAGNOSIS — R911 Solitary pulmonary nodule: Secondary | ICD-10-CM

## 2017-09-29 DIAGNOSIS — C3432 Malignant neoplasm of lower lobe, left bronchus or lung: Secondary | ICD-10-CM

## 2017-09-29 DIAGNOSIS — I700 Atherosclerosis of aorta: Secondary | ICD-10-CM | POA: Insufficient documentation

## 2017-09-29 NOTE — Progress Notes (Signed)
[address]       [place] [address]             [phone]       HPI: Katrina Hunter returns for a scheduled postop visit  Mrs. Meland is a 76 year old woman who had a pacemaker placed several years ago On her preprocedure a chest x-ray she was found to have a lung nodule. She is a lifelong nonsmoker. Navigational bronchoscopy was nondiagnostic, so the nodule was followed. In April she had a PET/CT which showed the nodule had increased in size and was mildly hypermetabolic.  I did a wedge resection and node dissection on 06/29/2017. She did well postoperatively and on  postoperative day 3.  She saw Dr. Julien Nordmann in August. She did not have any indication for adjuvant therapy.  She has been feeling well. She does not have any pain or shortness of breath. Her appetite is good and her weight is stable.  Past Medical History:  Diagnosis Date  . Aneurysm ([place])    aortic   . 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 Headache    migraines  . Heart murmur   . History of prolapse of bladder   . HLD (hyperlipidemia)   . HTN (hypertension)   . Hypothyroidism     no rx  . Pacemaker 12  . Pulmonary nodule, left 04/22/2017   From order notes     Current Outpatient Prescriptions  Medication Sig Dispense Refill  . amLODipine-benazepril (LOTREL) 10-20 MG per capsule Take 1 capsule by mouth daily.      Marland Kitchen azelastine (ASTELIN) 137 MCG/SPRAY nasal spray Place 1 spray into both nostrils daily as needed for allergies.     . benzonatate (TESSALON PERLES) 100 MG capsule Take 1 capsule (100 mg total) by mouth 3 (three) times daily as needed for cough. 20 capsule 2  . cetirizine (ZYRTEC) 10 MG tablet Take 10 mg by mouth daily. Pt takes PRN    . cholecalciferol (VITAMIN D) 1000 UNITS tablet Take 2,000 Units by mouth daily.     . Cyanocobalamin (B-12) 3000 MCG CAPS Take 3,000 mcg by mouth daily.    . pravastatin (PRAVACHOL) 20 MG tablet Take 20 mg by mouth at  bedtime.     . triamterene-hydrochlorothiazide (MAXZIDE-25) 37.5-25 MG per tablet Take 1 tablet by mouth daily.       No current facility-administered medications for this visit.     Physical Exam BP 134/81   Pulse 73   Ht 5\' 4"  (1.626 m)   Wt 141 lb (64 kg)   SpO2 98%   BMI 24.40 kg/m  76 year old woman in no acute distress Alert and oriented 3 with no focal deficits No cervical or supraclavicular adenopathy Incisions well healed Lungs clear with equal breath sounds bilaterally Cardiac regular rate and rhythm, normal S1 and S2  Diagnostic Tests: CHEST  2 VIEW  COMPARISON:  PA and lateral chest x-ray of July 28, 2017  FINDINGS: There is stable density at the left lung base with some pleural fluid or pleural thickening. Decreased retrocardiac density is present. On the right a loop of bowel is interposed between the hemidiaphragm in the dome of the liver. This is stable. The heart and pulmonary vascularity are normal. The ICD is in stable position. There is calcification in the wall of the thoracic aorta. The observed bony thorax exhibits no acute abnormality.  IMPRESSION: Postsurgical changes at the left  base, stable. No evidence of recurrent malignancy, pneumonia, or CHF.  Thoracic aortic atherosclerosis.   Electronically Signed   By: David  Martinique M.D.   On: 09/29/2017 09:20 I personally reviewed the chest x-ray and concur with the findings noted above  Impression: Katrina Hunter is a 76 year old nonsmoker who was incidentally found to have a lung nodule several years ago. This nodule very slowly increased in size and was mildly hypermetabolic on PET/CT. I did a wedge resection and node dissection in July 2018. 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 She did not require adjuvant therapy.  She has recovered from the surgery uneventfully without any issues.  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 This is been described as 5.1 cm on some radiology  reports. That needs continued follow-up. Looking back it has been 6 months since her PET/CT and 9 months since her chest CT. She needs a repeat CT. We'll plan to do that in a couple of weeks. I'll see her back after the scan is done.  Hypertension- blood pressure control is reasonably good at this point.  Plan: Return in 2 weeks with CT of chest to evaluate ascending aneurysm. She will need a scan every 6 months on ongoing basis. We will use CT for now as she needs CT for follow-up of her lung nodules, going for we may be able to use MR intermittently with CT to decrease the amount of radiation.  Melrose Nakayama, MD [facility] [phone]

## 2017-10-13 ENCOUNTER — Ambulatory Visit
Admission: RE | Admit: 2017-10-13 | Discharge: 2017-10-13 | Disposition: A | Discharge status: Home / Self Care | Payer: Medicare Other | Source: Ambulatory Visit | Attending: Thoracic Surgery (Cardiothoracic Vascular Surgery) | Admitting: Thoracic Surgery (Cardiothoracic Vascular Surgery)

## 2017-10-13 ENCOUNTER — Ambulatory Visit (INDEPENDENT_AMBULATORY_CARE_PROVIDER_SITE_OTHER): Payer: Medicare Other | Admitting: Thoracic Surgery (Cardiothoracic Vascular Surgery)

## 2017-10-13 ENCOUNTER — Encounter: Payer: Self-pay | Admitting: Thoracic Surgery (Cardiothoracic Vascular Surgery)

## 2017-10-13 VITALS — BP 128/68 | HR 88 | Resp 20 | Ht 64.0 in | Wt 141.0 lb

## 2017-10-13 DIAGNOSIS — R911 Solitary pulmonary nodule: Secondary | ICD-10-CM | POA: Diagnosis not present

## 2017-10-13 DIAGNOSIS — I712 Thoracic aortic aneurysm, without rupture, unspecified: Secondary | ICD-10-CM

## 2017-10-13 DIAGNOSIS — I7121 Aneurysm of the ascending aorta, without rupture: Secondary | ICD-10-CM

## 2017-10-13 DIAGNOSIS — C3432 Malignant neoplasm of lower lobe, left bronchus or lung: Secondary | ICD-10-CM

## 2017-10-13 MED ORDER — IOPAMIDOL (ISOVUE-300) INJECTION 61%
75.0000 mL | Freq: Once | INTRAVENOUS | Status: AC | PRN
  Administered 2017-10-13: 75 mL via INTRAVENOUS

## 2017-10-13 NOTE — Progress Notes (Signed)
[address]       [place] [address]             [phone]      Katrina Hunter returns to discuss results of her CT angiogram.  Katrina Hunter is a 76 year old woman who had a wedge resection and lymph node dissection for a stage IA adenocarcinoma in July 2018. She did well postoperatively and went home on day 3. She will not require adjuvant therapy.  I saw her 2 weeks ago in the office. She was doing well. We noted that she had not had a CT in over 6 months for follow-up of her ascending aneurysm.  She had a cough shortly after she saw me. Dr. Sabra Heck treated her with antibiotics.  Past Medical History:  Diagnosis Date  . 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 Headache    migraines  . Heart murmur   . History of prolapse of bladder   . HLD (hyperlipidemia)   . HTN (hypertension)   . Hypothyroidism     no rx  . Pacemaker 12  . Pulmonary nodule, left 04/22/2017   From order notes    Allergies  Allergen Reactions  . Crestor [Rosuvastatin Calcium] Other (See Comments)    myalgia  . Sulfonamide Derivatives Rash    Review of Systems See history of present illness  BP 128/68   Pulse 88   Resp 20   Ht 5\' 4"  (1.626 m)   Wt 141 lb (64 kg)   SpO2 95% Comment: RA  BMI 24.20 kg/m  Physical Exam 76 year old woman in no acute distress Alert and oriented 3 with no focal deficits Lungs clear with equal breath sounds bilaterally Cardiac regular rate and rhythm with no murmur   Diagnostic Tests: CT CHEST WITH CONTRAST  TECHNIQUE: Multidetector CT imaging of the chest was performed during intravenous contrast administration.  Creatinine was obtained on site at [facility] at [address]  Results: Creatinine 1.3 mg/dL.  CONTRAST:  75mL ISOVUE-300 IOPAMIDOL (ISOVUE-300) INJECTION 61%  COMPARISON:  PET-CT dated April 22, 2017. CT chest dated January 28, 2017.  FINDINGS: Cardiovascular: Normal heart size. No pericardial effusion. Unchanged left chest wall AICD. Stable to slight interval increase in size of the ascending thoracic aortic aneurysm, now measuring 5.2 cm, previously 5.1 cm. Aortic atherosclerosis.  Mediastinum/Nodes: No enlarged mediastinal, hilar, or axillary lymph nodes. Stable small left thyroid nodule. The trachea and esophagus demonstrate no significant abnormalities.  Lungs/Pleura: Postsurgical changes related to interval left lower lobe wedge resection, with new scarring in the left lower lobe and lingula. Focal thickening along the superior major fissure is favored to be scarring as well. No suspicious pulmonary nodules. No pleural effusion or pneumothorax.  Upper Abdomen: No acute abnormality.  Musculoskeletal: No chest wall abnormality. No acute or significant osseous findings.  IMPRESSION: 1. Stable to minimally increased ascending thoracic aortic aneurysm, measuring 5.2 cm, previously 5.1 cm. Recommend semi-annual imaging followup by CTA or MRA and referral to cardiothoracic surgery if not already obtained. This recommendation follows 2010 ACCF/AHA/AATS/ACR/ASA/SCA/SCAI/SIR/STS/SVM Guidelines for the Diagnosis and Management of Patients With Thoracic Aortic Disease. Circulation. 2010; 121: Z169-C789 2. Postsurgical changes related to interval left lower lobe wedge resection for previously noted spiculated pulmonary nodule. No new suspicious nodules or lymphadenopathy. 3.  Aortic atherosclerosis (ICD10-I70.0).   Electronically Signed   By: Orville Govern.D.  On: 10/13/2017 10:06 I personally reviewed the CT images. I disagree with the stated measurements. It was clearly measured an area of curvature. I get a maximum diameter of 4.5 cm in the coronal views and will 4.7-4.8 cm in the sagittal views.  Impression: Katrina Hunter is a 76 year old woman who had a wedge resection for a stage  IA adenocarcinoma about 3 months ago. She has done extremely well with the surgery. She will be followed by Dr. Julien Nordmann with CT scans every 6 months for the first 2 years.  She also has an ascending aneurysm.this was first noted in 2012 when she was having a pacemaker placed. The aneurysm was about 4.2 x 4.4 cm at that time. On her most recent scan I measured 4.5 cm in the coronal views and 4.7-4.8 cm in the sagittal views. The 5.1-5.2 cm measurement is clearly in an area of significant curvature and is not a true diameter. There is no indication for surgery at this time but she does need continued follow-up at six-month intervals.  Blood pressure- well-controlled  Plan: She is scheduled to have a CT scan with Dr. Julien Nordmann in February. I will see her back after that and then hopefully we can get on the same schedule with her scans from then on.  Melrose Nakayama, MD [facility] [phone]

## 2018-02-08 ENCOUNTER — Ambulatory Visit (HOSPITAL_COMMUNITY)
Admission: RE | Admit: 2018-02-08 | Discharge: 2018-02-08 | Disposition: A | Discharge status: Home / Self Care | Payer: Medicare Other | Source: Ambulatory Visit | Attending: Internal Medicine | Admitting: Internal Medicine

## 2018-02-08 ENCOUNTER — Inpatient Hospital Stay: Payer: Medicare Other | Attending: Internal Medicine

## 2018-02-08 ENCOUNTER — Encounter (HOSPITAL_COMMUNITY): Payer: Self-pay

## 2018-02-08 DIAGNOSIS — C3492 Malignant neoplasm of unspecified part of left bronchus or lung: Secondary | ICD-10-CM

## 2018-02-08 DIAGNOSIS — I712 Thoracic aortic aneurysm, without rupture: Secondary | ICD-10-CM | POA: Diagnosis not present

## 2018-02-08 DIAGNOSIS — Z9889 Other specified postprocedural states: Secondary | ICD-10-CM | POA: Diagnosis not present

## 2018-02-08 DIAGNOSIS — I10 Essential (primary) hypertension: Secondary | ICD-10-CM

## 2018-02-08 DIAGNOSIS — Z85118 Personal history of other malignant neoplasm of bronchus and lung: Secondary | ICD-10-CM | POA: Insufficient documentation

## 2018-02-08 HISTORY — DX: Malignant neoplasm of unspecified part of unspecified bronchus or lung: C34.90

## 2018-02-08 LAB — CBC WITH DIFFERENTIAL/PLATELET
BASOS ABS: 0.1 10*3/uL (ref 0.0–0.1)
Basophils Relative: 1 %
EOS PCT: 2 %
Eosinophils Absolute: 0.1 10*3/uL (ref 0.0–0.5)
HEMATOCRIT: 38.2 % (ref 34.8–46.6)
Hemoglobin: 12.6 g/dL (ref 11.6–15.9)
LYMPHS ABS: 1.4 10*3/uL (ref 0.9–3.3)
LYMPHS PCT: 29 %
MCH: 28.2 pg (ref 25.1–34.0)
MCHC: 33.1 g/dL (ref 31.5–36.0)
MCV: 85.2 fL (ref 79.5–101.0)
MONO ABS: 0.4 10*3/uL (ref 0.1–0.9)
Monocytes Relative: 8 %
NEUTROS ABS: 2.9 10*3/uL (ref 1.5–6.5)
Neutrophils Relative %: 60 %
PLATELETS: 256 10*3/uL (ref 145–400)
RBC: 4.48 MIL/uL (ref 3.70–5.45)
RDW: 13.9 % (ref 11.2–14.5)
WBC: 5 10*3/uL (ref 3.9–10.3)

## 2018-02-08 LAB — COMPREHENSIVE METABOLIC PANEL
ALBUMIN: 4.1 g/dL (ref 3.5–5.0)
ALT: 7 U/L (ref 0–55)
ANION GAP: 10 (ref 3–11)
AST: 18 U/L (ref 5–34)
Alkaline Phosphatase: 115 U/L (ref 40–150)
BUN: 23 mg/dL (ref 7–26)
CO2: 25 mmol/L (ref 22–29)
Calcium: 9.8 mg/dL (ref 8.4–10.4)
Chloride: 105 mmol/L (ref 98–109)
Creatinine, Ser: 1.19 mg/dL — ABNORMAL HIGH (ref 0.60–1.10)
GFR calc Af Amer: 50 mL/min — ABNORMAL LOW (ref >60)
GFR calc non Af Amer: 43 mL/min — ABNORMAL LOW (ref >60)
GLUCOSE: 88 mg/dL (ref 70–140)
POTASSIUM: 4.5 mmol/L (ref 3.5–5.1)
SODIUM: 140 mmol/L (ref 136–145)
TOTAL PROTEIN: 7.7 g/dL (ref 6.4–8.3)
Total Bilirubin: 1 mg/dL (ref 0.2–1.2)

## 2018-02-08 MED ORDER — IOPAMIDOL (ISOVUE-300) INJECTION 61%
INTRAVENOUS | Status: AC
  Filled 2018-02-08: qty 75

## 2018-02-08 MED ORDER — IOPAMIDOL (ISOVUE-300) INJECTION 61%
60.0000 mL | Freq: Once | INTRAVENOUS | Status: AC | PRN
  Administered 2018-02-08: 60 mL via INTRAVENOUS

## 2018-02-08 MED ORDER — SODIUM CHLORIDE 0.9 % IJ SOLN
INTRAMUSCULAR | Status: AC
  Filled 2018-02-08: qty 50

## 2018-02-15 ENCOUNTER — Inpatient Hospital Stay (HOSPITAL_BASED_OUTPATIENT_CLINIC_OR_DEPARTMENT_OTHER): Payer: Medicare Other | Admitting: Internal Medicine

## 2018-02-15 ENCOUNTER — Encounter: Payer: Self-pay | Admitting: Internal Medicine

## 2018-02-15 VITALS — BP 143/75 | HR 80 | Temp 98.0°F | Resp 18 | Ht 64.0 in | Wt 145.0 lb

## 2018-02-15 DIAGNOSIS — I700 Atherosclerosis of aorta: Secondary | ICD-10-CM

## 2018-02-15 DIAGNOSIS — Z85118 Personal history of other malignant neoplasm of bronchus and lung: Secondary | ICD-10-CM

## 2018-02-15 DIAGNOSIS — Z950 Presence of cardiac pacemaker: Secondary | ICD-10-CM | POA: Diagnosis not present

## 2018-02-15 DIAGNOSIS — I10 Essential (primary) hypertension: Secondary | ICD-10-CM | POA: Diagnosis not present

## 2018-02-15 DIAGNOSIS — I712 Thoracic aortic aneurysm, without rupture: Secondary | ICD-10-CM

## 2018-02-15 DIAGNOSIS — C3490 Malignant neoplasm of unspecified part of unspecified bronchus or lung: Secondary | ICD-10-CM

## 2018-02-15 NOTE — Progress Notes (Signed)
[place] Telephone:([phone]) [phone]   Fax:([phone]) [phone]  OFFICE PROGRESS NOTE  [name], MD [address] [place] [address]  DIAGNOSIS: Stage IA (T1b, N0, M0) non-small cell lung cancer, adenocarcinoma diagnosed in July 2018, presented with left lower lobe lung nodule.  PRIOR THERAPY: Status post left lower lobe wedge resection with lymph node dissection under the care of Dr. Roxan Hockey on 06/29/2017.  CURRENT THERAPY: Observation.  INTERVAL HISTORY: Katrina Hunter 77 y.o. female returns to the clinic today for returns to the clinic today for 6 months follow-up visit accompanied by her husband.  The patient is feeling fine today with no specific complaints.  She denied having any chest pain, shortness of breath, cough or hemoptysis.  She has no fever or chills.  She denied having any nausea, vomiting, diarrhea or constipation.  She had repeat CT scan of the chest performed recently and she is here for evaluation and discussion of her scan results.  MEDICAL HISTORY: Past Medical History:  Diagnosis Date  . 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 Headache    migraines  . Heart murmur   . History of prolapse of bladder   . HLD (hyperlipidemia)   . HTN (hypertension)   . Hypothyroidism     no rx  . Lung cancer ([place]) dx'd 06/2017  . Pacemaker 12  . Pulmonary nodule, left 04/22/2017   From order notes    ALLERGIES:  is allergic to crestor [rosuvastatin calcium] and sulfonamide derivatives.  MEDICATIONS:  Current Outpatient Medications  Medication Sig Dispense Refill  . amLODipine-benazepril (LOTREL) 10-20 MG per capsule Take 1 capsule by mouth daily.      Marland Kitchen azelastine (ASTELIN) 137 MCG/SPRAY nasal spray Place 1 spray into both nostrils daily as needed for allergies.     . cholecalciferol (VITAMIN D) 1000 UNITS tablet Take 2,000 Units by mouth daily.     .  Cyanocobalamin (B-12) 3000 MCG CAPS Take 3,000 mcg by mouth daily.    . pravastatin (PRAVACHOL) 20 MG tablet Take 20 mg by mouth at bedtime.     . triamterene-hydrochlorothiazide (MAXZIDE-25) 37.5-25 MG per tablet Take 1 tablet by mouth daily.      . cyanocobalamin 100 MCG tablet Take by mouth.     No current facility-administered medications for this visit.     SURGICAL HISTORY:  Past Surgical History:  Procedure Laterality Date  . BLADDER SURGERY  01/03/2017  . LYMPH NODE DISSECTION Left 06/29/2017   Procedure: LYMPH NODE DISSECTION LEFT LUNG;  Surgeon: Melrose Nakayama, MD;  Location: [facility];  Service: Thoracic;  Laterality: Left;  . PACEMAKER INSERTION  07/2011   [name]  . TONSILLECTOMY  age 54  . TUBAL LIGATION    . VIDEO ASSISTED THORACOSCOPY (VATS)/WEDGE RESECTION Left 06/29/2017   Procedure: VIDEO ASSISTED THORACOSCOPY (VATS)/WEDGE RESECTION;  Surgeon: Melrose Nakayama, MD;  Location: [facility];  Service: Thoracic;  Laterality: Left;  Marland Kitchen VIDEO BRONCHOSCOPY WITH ENDOBRONCHIAL NAVIGATION N/A 12/05/2013   Procedure: VIDEO BRONCHOSCOPY WITH ENDOBRONCHIAL NAVIGATION;  Surgeon: Collene Gobble, MD;  Location: [facility];  Service: Thoracic;  Laterality: N/A;    REVIEW OF SYSTEMS:  A comprehensive review of systems was negative.   PHYSICAL EXAMINATION: General appearance: alert, cooperative and no distress Head: Normocephalic, without obvious abnormality, atraumatic Neck: no adenopathy, no JVD, supple, symmetrical, trachea midline and  thyroid not enlarged, symmetric, no tenderness/mass/nodules Lymph nodes: Cervical, supraclavicular, and axillary nodes normal. Resp: clear to auscultation bilaterally Back: symmetric, no curvature. ROM normal. No CVA tenderness. Cardio: regular rate and rhythm, S1, S2 normal, no murmur, click, rub or gallop GI: soft, non-tender; bowel sounds normal; no masses,  no organomegaly Extremities: extremities normal, atraumatic, no cyanosis or edema  ECOG  PERFORMANCE STATUS: 1 - Symptomatic but completely ambulatory  Blood pressure (!) 143/75, pulse 80, temperature 98 F (36.7 C), temperature source Oral, resp. rate 18, height 5\' 4"  (1.626 m), weight 145 lb (65.8 kg), SpO2 98 %.  LABORATORY DATA: Lab Results  Component Value Date   WBC 5.0 02/08/2018   HGB 12.6 02/08/2018   HCT 38.2 02/08/2018   MCV 85.2 02/08/2018   PLT 256 02/08/2018      Chemistry      Component Value Date/Time   NA 140 02/08/2018 1130   NA 141 08/06/2017 1507   K 4.5 02/08/2018 1130   K 3.6 08/06/2017 1507   CL 105 02/08/2018 1130   CO2 25 02/08/2018 1130   CO2 30 (H) 08/06/2017 1507   BUN 23 02/08/2018 1130   BUN 21.4 08/06/2017 1507   CREATININE 1.19 (H) 02/08/2018 1130   CREATININE 1.3 (H) 08/06/2017 1507      Component Value Date/Time   CALCIUM 9.8 02/08/2018 1130   CALCIUM 10.4 08/06/2017 1507   ALKPHOS 115 02/08/2018 1130   ALKPHOS 237 (H) 08/06/2017 1507   AST 18 02/08/2018 1130   AST 26 08/06/2017 1507   ALT 7 02/08/2018 1130   ALT 21 08/06/2017 1507   BILITOT 1.0 02/08/2018 1130   BILITOT 0.65 08/06/2017 1507       RADIOGRAPHIC STUDIES: Ct Chest W Contrast  Result Date: 02/08/2018 CLINICAL DATA:  Lung cancer. EXAM: CT CHEST WITH CONTRAST TECHNIQUE: Multidetector CT imaging of the chest was performed during intravenous contrast administration. CONTRAST:  77mL ISOVUE-300 IOPAMIDOL (ISOVUE-300) INJECTION 61% COMPARISON:  None. FINDINGS: Cardiovascular: Heart size normal. No pericardial effusion. Coronary artery calcification is evident. Atherosclerotic calcification is noted in the wall of the thoracic aorta. Ascending thoracic aorta measures up to 4.7 cm diameter on today's study. Left-sided permanent pacemaker noted. Mediastinum/Nodes: No mediastinal lymphadenopathy. There is no hilar lymphadenopathy. The esophagus has normal imaging features. There is no axillary lymphadenopathy. Lungs/Pleura: Similar appearance of surgical scarring left  lower lobe. No suspicious pulmonary nodule or mass. Upper Abdomen: Tiny calcified gallstone. Renal cortical thinning identified although kidneys incompletely visualized. Musculoskeletal: Bone windows reveal no worrisome lytic or sclerotic osseous lesions. IMPRESSION: 1. Stable. Postsurgical changes left lower lobe. No evidence for recurrent disease. 2. Similar appearance ascending thoracic aorta measuring up to 4.7 cm diameter. Ascending thoracic aortic aneurysm. Recommend semi-annual imaging followup by CTA or MRA and referral to cardiothoracic surgery if not already obtained. This recommendation follows 2010 ACCF/AHA/AATS/ACR/ASA/SCA/SCAI/SIR/STS/SVM Guidelines for the Diagnosis and Management of Patients With Thoracic Aortic Disease. Circulation. 2010; 121: O242-P536 Electronically Signed   By: Misty Stanley M.D.   On: 02/08/2018 14:35    ASSESSMENT AND PLAN: This is a very pleasant 77 years old white female with a stage Ia non-small cell lung cancer, adenocarcinoma status post left lower lobe wedge resection with lymph node dissection under the care of Dr. Roxan Hockey in July 2018. The patient is currently on observation and she is feeling fine. Repeat CT scan of the chest performed recently showed no evidence for disease recurrence or progression. I discussed the scan results with the patient  and her husband and recommended for her to continue on observation with repeat CT scan of the chest in 6 months for restaging of her disease. For the ascending thoracic aortic aneurysm, the patient will continue her routine follow-up and evaluation by Dr. Roxan Hockey. She was advised to call immediately if she has any concerning symptoms in the interval. The patient voices understanding of current disease status and treatment options and is in agreement with the current care plan.  All questions were answered. The patient knows to call the clinic with any problems, questions or concerns. We can certainly see the  patient much sooner if necessary.  I spent 10 minutes counseling the patient face to face. The total time spent in the appointment was 15 minutes.  Disclaimer: This note was dictated with voice recognition software. Similar sounding words can inadvertently be transcribed and may not be corrected upon review.

## 2018-02-16 ENCOUNTER — Telehealth: Payer: Self-pay | Admitting: Internal Medicine

## 2018-02-16 NOTE — Telephone Encounter (Signed)
Scheduled appt per 2/18 los - Sent reminder letter in the mail - lab and f/u in 6 months.

## 2018-02-23 ENCOUNTER — Ambulatory Visit: Payer: Medicare Other | Admitting: Thoracic Surgery (Cardiothoracic Vascular Surgery)

## 2018-03-09 ENCOUNTER — Ambulatory Visit (INDEPENDENT_AMBULATORY_CARE_PROVIDER_SITE_OTHER): Payer: Medicare Other | Admitting: Thoracic Surgery (Cardiothoracic Vascular Surgery)

## 2018-03-09 ENCOUNTER — Other Ambulatory Visit: Payer: Self-pay

## 2018-03-09 ENCOUNTER — Encounter: Payer: Self-pay | Admitting: Thoracic Surgery (Cardiothoracic Vascular Surgery)

## 2018-03-09 VITALS — BP 117/73 | HR 84 | Resp 18 | Ht 64.0 in | Wt 140.0 lb

## 2018-03-09 DIAGNOSIS — I712 Thoracic aortic aneurysm, without rupture: Secondary | ICD-10-CM | POA: Diagnosis not present

## 2018-03-09 DIAGNOSIS — I7121 Aneurysm of the ascending aorta, without rupture: Secondary | ICD-10-CM

## 2018-03-09 NOTE — Progress Notes (Signed)
[address]       [place] [address]             [phone]     HPI: Katrina Hunter returns for a scheduled six-month follow-up.  She is a 77 year old woman who had a left lower lobe wedge resection lymph node dissection for a stage IA non-small cell carcinoma in July 2018.  She did not require adjuvant therapy.  She also has an ascending aortic aneurysm.  She saw Dr. Julien Nordmann in February.  She had no evidence of recurrent disease.  Shortly after that visit she came down with a respiratory infection.  She was treated with antibiotics for a couple of weeks.  She said she had a chest x-ray which showed some evidence of infection at the base of both lungs.  She has now finally recovered from that.  She did have a poor appetite and lost some weight while she was ill.  Her appetite has returned.  Past Medical History:  Diagnosis Date  . 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 Headache    migraines  . Heart murmur   . History of prolapse of bladder   . HLD (hyperlipidemia)   . HTN (hypertension)   . Hypothyroidism     no rx  . Lung cancer ([place]) dx'd 06/2017  . Pacemaker 12  . Pulmonary nodule, left 04/22/2017   From order notes    Current Outpatient Medications  Medication Sig Dispense Refill  . amLODipine-benazepril (LOTREL) 10-20 MG per capsule Take 1 capsule by mouth daily.      Marland Kitchen azelastine (ASTELIN) 137 MCG/SPRAY nasal spray Place 1 spray into both nostrils daily as needed for allergies.     . cholecalciferol (VITAMIN D) 1000 UNITS tablet Take 2,000 Units by mouth daily.     . Cyanocobalamin (B-12) 3000 MCG CAPS Take 3,000 mcg by mouth daily.    . cyanocobalamin 100 MCG tablet Take by mouth.    . pravastatin (PRAVACHOL) 20 MG tablet Take 20 mg by mouth at bedtime.     . triamterene-hydrochlorothiazide (MAXZIDE-25) 37.5-25 MG per tablet Take 1 tablet by mouth daily.       No current facility-administered  medications for this visit.     Physical Exam BP 117/73 (BP Location: Left Arm, Patient Position: Sitting, Cuff Size: Normal)   Pulse 84   Resp 18   Ht 5\' 4"  (1.626 m)   Wt 140 lb (63.5 kg)   SpO2 98% Comment: RA  BMI 24.76 kg/m  77 year old woman in no acute distress Alert and oriented x3 with no focal deficits Cardiac regular rate and rhythm normal S1 and S2, no murmur Lungs clear with equal breath sounds bilaterally, no rales or wheezing No cervical or subclavicular adenopathy  Diagnostic Tests: CT CHEST WITH CONTRAST  TECHNIQUE: Multidetector CT imaging of the chest was performed during intravenous contrast administration.  CONTRAST:  43mL ISOVUE-300 IOPAMIDOL (ISOVUE-300) INJECTION 61%  COMPARISON:  None.  FINDINGS: Cardiovascular: Heart size normal. No pericardial effusion. Coronary artery calcification is evident. Atherosclerotic calcification is noted in the wall of the thoracic aorta. Ascending thoracic aorta measures up to 4.7 cm diameter on today's study. Left-sided permanent pacemaker noted.  Mediastinum/Nodes: No mediastinal lymphadenopathy. There is no hilar lymphadenopathy. The esophagus has normal imaging features. There is no axillary lymphadenopathy.  Lungs/Pleura: Similar appearance of surgical scarring left lower lobe. No suspicious  pulmonary nodule or mass.  Upper Abdomen: Tiny calcified gallstone. Renal cortical thinning identified although kidneys incompletely visualized.  Musculoskeletal: Bone windows reveal no worrisome lytic or sclerotic osseous lesions.  IMPRESSION: 1. Stable. Postsurgical changes left lower lobe. No evidence for recurrent disease. 2. Similar appearance ascending thoracic aorta measuring up to 4.7 cm diameter. Ascending thoracic aortic aneurysm. Recommend semi-annual imaging followup by CTA or MRA and referral to cardiothoracic surgery if not already obtained. This recommendation follows 2010  ACCF/AHA/AATS/ACR/ASA/SCA/SCAI/SIR/STS/SVM Guidelines for the Diagnosis and Management of Patients With Thoracic Aortic Disease. Circulation. 2010; 121: Q333-L456   Electronically Signed   By: Misty Stanley M.D.   On: 02/08/2018 14:35 I personally reviewed the CT images and concur with the findings noted above.  Impression: Katrina Hunter is a 77 year old woman with a history of ascending thoracic aneurysm and atherosclerosis, hypertension, and a stage IA adenocarcinoma.    Stage IA adenocarcinoma-currently under observation.  Being followed by Dr. Julien Nordmann.  CT in February showed no evidence of recurrent disease.  She is a lifelong non-smoker  Ascending aortic atherosclerosis and aneurysm-the measurements from radiology on her aneurysm are all over the place.  They range from 4.5-5.1 cm over the last 5 years.  I see no significant change and it is still is in the 4.5-4.7 cm range.  She is on a statin for atherosclerosis.  Blood pressure control is the mainstay of treatment for the aneurysm.  Hypertension-blood pressure well controlled on current regimen  Plan: Return in 6 months after CT of chest  Katrina Nakayama, MD [facility] [phone]

## 2018-03-24 ENCOUNTER — Other Ambulatory Visit: Payer: Self-pay | Admitting: Internal Medicine

## 2018-03-24 DIAGNOSIS — Z1231 Encounter for screening mammogram for malignant neoplasm of breast: Secondary | ICD-10-CM

## 2018-04-29 ENCOUNTER — Ambulatory Visit
Admission: RE | Admit: 2018-04-29 | Discharge: 2018-04-29 | Disposition: A | Discharge status: Home / Self Care | Payer: Medicare Other | Source: Ambulatory Visit | Attending: Internal Medicine | Admitting: Internal Medicine

## 2018-04-29 DIAGNOSIS — Z1231 Encounter for screening mammogram for malignant neoplasm of breast: Secondary | ICD-10-CM | POA: Insufficient documentation

## 2018-08-11 ENCOUNTER — Telehealth: Payer: Self-pay | Admitting: Internal Medicine

## 2018-08-11 NOTE — Telephone Encounter (Signed)
MM PAL - moved 8/20 f/u to 9/5. Spoke with patient she is aware.

## 2018-08-13 ENCOUNTER — Ambulatory Visit (HOSPITAL_COMMUNITY)
Admission: RE | Admit: 2018-08-13 | Discharge: 2018-08-13 | Disposition: A | Discharge status: Home / Self Care | Payer: Medicare Other | Source: Ambulatory Visit | Attending: Internal Medicine | Admitting: Internal Medicine

## 2018-08-13 ENCOUNTER — Inpatient Hospital Stay: Payer: Medicare Other | Attending: Internal Medicine

## 2018-08-13 DIAGNOSIS — I712 Thoracic aortic aneurysm, without rupture: Secondary | ICD-10-CM | POA: Insufficient documentation

## 2018-08-13 DIAGNOSIS — I700 Atherosclerosis of aorta: Secondary | ICD-10-CM | POA: Insufficient documentation

## 2018-08-13 DIAGNOSIS — Z950 Presence of cardiac pacemaker: Secondary | ICD-10-CM | POA: Insufficient documentation

## 2018-08-13 DIAGNOSIS — C3490 Malignant neoplasm of unspecified part of unspecified bronchus or lung: Secondary | ICD-10-CM

## 2018-08-13 DIAGNOSIS — Z85118 Personal history of other malignant neoplasm of bronchus and lung: Secondary | ICD-10-CM | POA: Insufficient documentation

## 2018-08-13 DIAGNOSIS — I10 Essential (primary) hypertension: Secondary | ICD-10-CM | POA: Insufficient documentation

## 2018-08-13 DIAGNOSIS — K8020 Calculus of gallbladder without cholecystitis without obstruction: Secondary | ICD-10-CM | POA: Insufficient documentation

## 2018-08-13 DIAGNOSIS — C3492 Malignant neoplasm of unspecified part of left bronchus or lung: Secondary | ICD-10-CM | POA: Insufficient documentation

## 2018-08-13 LAB — CBC WITH DIFFERENTIAL (CANCER CENTER ONLY)
BASOS ABS: 0.1 10*3/uL (ref 0.0–0.1)
Basophils Relative: 1 %
Eosinophils Absolute: 0.1 10*3/uL (ref 0.0–0.5)
Eosinophils Relative: 1 %
HEMATOCRIT: 37.1 % (ref 34.8–46.6)
Hemoglobin: 12.3 g/dL (ref 11.6–15.9)
LYMPHS PCT: 18 %
Lymphs Abs: 1.1 10*3/uL (ref 0.9–3.3)
MCH: 28.5 pg (ref 25.1–34.0)
MCHC: 33.2 g/dL (ref 31.5–36.0)
MCV: 85.9 fL (ref 79.5–101.0)
MONO ABS: 0.5 10*3/uL (ref 0.1–0.9)
Monocytes Relative: 9 %
NEUTROS ABS: 4.3 10*3/uL (ref 1.5–6.5)
Neutrophils Relative %: 71 %
Platelet Count: 261 10*3/uL (ref 145–400)
RBC: 4.32 MIL/uL (ref 3.70–5.45)
RDW: 13.8 % (ref 11.2–14.5)
WBC Count: 6 10*3/uL (ref 3.9–10.3)

## 2018-08-13 LAB — CMP (CANCER CENTER ONLY)
ALT: 9 U/L (ref 0–44)
AST: 17 U/L (ref 15–41)
Albumin: 4 g/dL (ref 3.5–5.0)
Alkaline Phosphatase: 110 U/L (ref 38–126)
Anion gap: 9 (ref 5–15)
BILIRUBIN TOTAL: 1.2 mg/dL (ref 0.3–1.2)
BUN: 26 mg/dL — AB (ref 8–23)
CALCIUM: 9.7 mg/dL (ref 8.9–10.3)
CO2: 28 mmol/L (ref 22–32)
CREATININE: 1.42 mg/dL — AB (ref 0.44–1.00)
Chloride: 104 mmol/L (ref 98–111)
GFR, EST NON AFRICAN AMERICAN: 35 mL/min — AB (ref >60)
GFR, Est AFR Am: 40 mL/min — ABNORMAL LOW (ref >60)
Glucose, Bld: 87 mg/dL (ref 70–99)
POTASSIUM: 3.3 mmol/L — AB (ref 3.5–5.1)
Sodium: 141 mmol/L (ref 135–145)
TOTAL PROTEIN: 7.7 g/dL (ref 6.5–8.1)

## 2018-08-13 MED ORDER — IOHEXOL 300 MG/ML  SOLN
75.0000 mL | Freq: Once | INTRAMUSCULAR | Status: AC | PRN
  Administered 2018-08-13: 60 mL via INTRAVENOUS

## 2018-08-17 ENCOUNTER — Ambulatory Visit: Payer: Medicare Other | Admitting: Internal Medicine

## 2018-08-18 ENCOUNTER — Encounter: Payer: Self-pay | Admitting: Internal Medicine

## 2018-08-19 NOTE — Telephone Encounter (Signed)
Called patient to advise per Mikey Bussing, NP response as to no obvious evidence of cancer.  Patient very appreciative and will be following up with Dr Julien Nordmann for full detail of report at her newly rescheduled appt time.

## 2018-08-31 ENCOUNTER — Ambulatory Visit: Payer: Medicare Other | Admitting: Thoracic Surgery (Cardiothoracic Vascular Surgery)

## 2018-09-02 ENCOUNTER — Inpatient Hospital Stay: Payer: Medicare Other | Attending: Internal Medicine | Admitting: Internal Medicine

## 2018-09-02 ENCOUNTER — Telehealth: Payer: Self-pay

## 2018-09-02 ENCOUNTER — Encounter: Payer: Self-pay | Admitting: Internal Medicine

## 2018-09-02 DIAGNOSIS — Z85118 Personal history of other malignant neoplasm of bronchus and lung: Secondary | ICD-10-CM | POA: Diagnosis present

## 2018-09-02 DIAGNOSIS — C3490 Malignant neoplasm of unspecified part of unspecified bronchus or lung: Secondary | ICD-10-CM

## 2018-09-02 NOTE — Telephone Encounter (Signed)
Printed avs and calender of upcoming appointment. Per 9/5 los

## 2018-09-02 NOTE — Progress Notes (Signed)
[place] [phone]   [phone]  OFFICE PROGRESS NOTE  [name], MD [address] [place] [address]  DIAGNOSIS: Stage IA (T1b, N0, M0) non-small cell lung cancer, adenocarcinoma diagnosed in July 2018, presented with left lower lobe lung nodule.  PRIOR THERAPY: Status post left lower lobe wedge resection with lymph node dissection under the care of Dr. Roxan Hockey on 06/29/2017.  CURRENT THERAPY: Observation.  INTERVAL HISTORY: Katrina Hunter 77 y.o. female returns to the clinic today for follow-up visit accompanied by her husband.  The patient is feeling fine today with no concerning complaints.  She denied having any chest pain, shortness of breath, cough or hemoptysis.  She denied having any fever or chills.  She has no nausea, vomiting, diarrhea or constipation.  She had repeat CT scan of the chest performed recently and she is here for evaluation and discussion of her risk her results.  MEDICAL HISTORY: Past Medical History:  Diagnosis Date  . Aneurysm ([place])    aortic   . AV block, complete ([place])    PPM placed 07/2011 [facility]  . Headache    migraines  . Heart murmur   . History of prolapse of bladder   . HLD (hyperlipidemia)   . HTN (hypertension)   . Hypothyroidism     no rx  . Lung cancer ([place]) dx'd 06/2017  . Pacemaker 12  . Pulmonary nodule, left 04/22/2017   From order notes    ALLERGIES:  is allergic to crestor [rosuvastatin calcium] and sulfonamide derivatives.  MEDICATIONS:  Current Outpatient Medications  Medication Sig Dispense Refill  . amLODipine-benazepril (LOTREL) 10-20 MG per capsule Take 1 capsule by mouth daily.      Marland Kitchen azelastine (ASTELIN) 137 MCG/SPRAY nasal spray Place 1 spray into both nostrils daily as needed for allergies.     . cholecalciferol (VITAMIN D) 1000 UNITS tablet Take 2,000 Units by mouth daily.     . Cyanocobalamin (B-12) 3000 MCG CAPS  Take 3,000 mcg by mouth daily.    . pravastatin (PRAVACHOL) 20 MG tablet Take 20 mg by mouth at bedtime.     . triamterene-hydrochlorothiazide (MAXZIDE-25) 37.5-25 MG per tablet Take 1 tablet by mouth daily.       No current facility-administered medications for this visit.     SURGICAL HISTORY:  Past Surgical History:  Procedure Laterality Date  . BLADDER SURGERY  01/03/2017  . LYMPH NODE DISSECTION Left 06/29/2017   Procedure: LYMPH NODE DISSECTION LEFT LUNG;  Surgeon: Melrose Nakayama, MD;  Location: [facility];  Service: Thoracic;  Laterality: Left;  . PACEMAKER INSERTION  07/2011   [name]  . TONSILLECTOMY  age 67  . TUBAL LIGATION    . VIDEO ASSISTED THORACOSCOPY (VATS)/WEDGE RESECTION Left 06/29/2017   Procedure: VIDEO ASSISTED THORACOSCOPY (VATS)/WEDGE RESECTION;  Surgeon: Melrose Nakayama, MD;  Location: [facility];  Service: Thoracic;  Laterality: Left;  Marland Kitchen VIDEO BRONCHOSCOPY WITH ENDOBRONCHIAL NAVIGATION N/A 12/05/2013   Procedure: VIDEO BRONCHOSCOPY WITH ENDOBRONCHIAL NAVIGATION;  Surgeon: Collene Gobble, MD;  [facility];  Service: Thoracic;  Laterality: N/A;    REVIEW OF SYSTEMS:  A comprehensive review of systems was negative.   PHYSICAL EXAMINATION: General appearance: alert, cooperative and no distress Head: Normocephalic, without obvious abnormality, atraumatic Neck: no adenopathy, no JVD, supple, symmetrical, trachea midline and thyroid not enlarged, symmetric, no tenderness/mass/nodules Lymph nodes: Cervical, supraclavicular, and axillary nodes normal. Resp: clear to auscultation  bilaterally Back: symmetric, no curvature. ROM normal. No CVA tenderness. Cardio: regular rate and rhythm, S1, S2 normal, no murmur, click, rub or gallop GI: soft, non-tender; bowel sounds normal; no masses,  no organomegaly Extremities: extremities normal, atraumatic, no cyanosis or edema  ECOG PERFORMANCE STATUS: 0 - Asymptomatic  Blood pressure (!) 145/78, pulse 90, temperature 97.9 F  (36.6 C), temperature source Oral, resp. rate (!) 189, height 5\' 4"  (1.626 m), weight 139 lb 8 oz (63.3 kg), SpO2 97 %.  LABORATORY DATA: Lab Results  Component Value Date   WBC 6.0 08/13/2018   HGB 12.3 08/13/2018   HCT 37.1 08/13/2018   MCV 85.9 08/13/2018   PLT 261 08/13/2018      Chemistry      Component Value Date/Time   NA 141 08/13/2018 1128   NA 141 08/06/2017 1507   K 3.3 (L) 08/13/2018 1128   K 3.6 08/06/2017 1507   CL 104 08/13/2018 1128   CO2 28 08/13/2018 1128   CO2 30 (H) 08/06/2017 1507   BUN 26 (H) 08/13/2018 1128   BUN 21.4 08/06/2017 1507   CREATININE 1.42 (H) 08/13/2018 1128   CREATININE 1.3 (H) 08/06/2017 1507      Component Value Date/Time   CALCIUM 9.7 08/13/2018 1128   CALCIUM 10.4 08/06/2017 1507   ALKPHOS 110 08/13/2018 1128   ALKPHOS 237 (H) 08/06/2017 1507   AST 17 08/13/2018 1128   AST 26 08/06/2017 1507   ALT 9 08/13/2018 1128   ALT 21 08/06/2017 1507   BILITOT 1.2 08/13/2018 1128   BILITOT 0.65 08/06/2017 1507       RADIOGRAPHIC STUDIES: Ct Chest W Contrast  Result Date: 08/13/2018 CLINICAL DATA:  Stage IA left lower lobe lung adenocarcinoma status post left lower lobe wedge resection 06/29/2017. Chest CT surveillance. EXAM: CT CHEST WITH CONTRAST TECHNIQUE: Multidetector CT imaging of the chest was performed during intravenous contrast administration. CONTRAST:  52mL OMNIPAQUE IOHEXOL 300 MG/ML  SOLN COMPARISON:  02/08/2018 chest CT. FINDINGS: Cardiovascular: Normal heart size. No significant pericardial effusion/thickening. Two lead left subclavian pacemaker with lead tips in the right atrium and right ventricular apex. Atherosclerotic thoracic aorta with stable 4.8 cm ascending thoracic aortic aneurysm using similar measurement technique. Normal caliber pulmonary arteries. No central pulmonary emboli. Mediastinum/Nodes: Stable hypodense 1.3 cm inferior left thyroid lobe nodule. Unremarkable esophagus. No pathologically enlarged axillary,  mediastinal or hilar lymph nodes. Lungs/Pleura: No pneumothorax. No pleural effusion. Status post left lower lobe wedge resection. Stable mildly thickened curvilinear parenchymal band along suture line in the left lower lobe, compatible with postsurgical scarring. No acute consolidative airspace disease, lung masses or significant pulmonary nodules. Upper abdomen: Simple 1.3 cm liver cyst adjacent to the gallbladder fossa is stable. A few scattered subcentimeter hypodense liver lesions are too small to characterize and are unchanged, probably benign. Cholelithiasis. Musculoskeletal: No aggressive appearing focal osseous lesions. Mild thoracic spondylosis. IMPRESSION: 1. No evidence of local tumor recurrence in the left lower lobe status post wedge resection. No findings of metastatic disease in the chest. 2. Stable 4.8 cm ascending thoracic aortic aneurysm. Ascending thoracic aortic aneurysm. Recommend semi-annual imaging followup by CTA or MRA and referral to cardiothoracic surgery if not already obtained. This recommendation follows 2010 ACCF/AHA/AATS/ACR/ASA/SCA/SCAI/SIR/STS/SVM Guidelines for the Diagnosis and Management of Patients With Thoracic Aortic Disease. Circulation. 2010; 121: E831-D176. 3. Cholelithiasis. Aortic Atherosclerosis (ICD10-I70.0). Electronically Signed   By: Ilona Sorrel M.D.   On: 08/13/2018 15:17    ASSESSMENT AND PLAN: This is a very  pleasant 77 years old white female with a stage Ia non-small cell lung cancer, adenocarcinoma status post left lower lobe wedge resection with lymph node dissection under the care of Dr. Roxan Hockey in July 2018. The patient is current on observation and she is feeling fine. She had repeat CT scan of the chest performed recently.  I personally and independently reviewed the scans and discussed the results with the patient and her husband today.  Her scan showed no concerning findings for disease recurrence.  She also continues to have a stable ascending  thoracic aortic aneurysm. I recommended for the patient to continue on observation with repeat CT scan of the chest in 6 months. She was advised to call immediately if she has any concerning symptoms in the interval. The patient voices understanding of current disease status and treatment options and is in agreement with the current care plan. All questions were answered. The patient knows to call the clinic with any problems, questions or concerns. We can certainly see the patient much sooner if necessary.  I spent 10 minutes counseling the patient face to face. The total time spent in the appointment was 15 minutes.  Disclaimer: This note was dictated with voice recognition software. Similar sounding words can inadvertently be transcribed and may not be corrected upon review.

## 2018-09-14 ENCOUNTER — Other Ambulatory Visit: Payer: Self-pay

## 2018-09-14 ENCOUNTER — Encounter: Payer: Self-pay | Admitting: Thoracic Surgery (Cardiothoracic Vascular Surgery)

## 2018-09-14 ENCOUNTER — Ambulatory Visit (INDEPENDENT_AMBULATORY_CARE_PROVIDER_SITE_OTHER): Payer: Medicare Other | Admitting: Thoracic Surgery (Cardiothoracic Vascular Surgery)

## 2018-09-14 VITALS — BP 132/83 | HR 80 | Resp 16 | Ht 64.0 in | Wt 139.0 lb

## 2018-09-14 DIAGNOSIS — Z09 Encounter for follow-up examination after completed treatment for conditions other than malignant neoplasm: Secondary | ICD-10-CM

## 2018-09-14 DIAGNOSIS — I712 Thoracic aortic aneurysm, without rupture: Secondary | ICD-10-CM | POA: Diagnosis not present

## 2018-09-14 DIAGNOSIS — C3432 Malignant neoplasm of lower lobe, left bronchus or lung: Secondary | ICD-10-CM

## 2018-09-14 DIAGNOSIS — I7121 Aneurysm of the ascending aorta, without rupture: Secondary | ICD-10-CM

## 2018-09-14 NOTE — Progress Notes (Signed)
[address]       [place] [address]             [phone]     HPI: Katrina Hunter returns for a scheduled follow-up visit.   She is a 77 year old woman who is a lifelong non-smoker.  She had a wedge resection and node sampling for a stage IA adenocarcinoma a year ago.  Her past history is also significant for hypertension, hyperlipidemia, aortic atherosclerosis, an ascending aortic aneurysm, and complete heart block requiring pacemaker.  I did a left lower lobe wedge resection and node sampling in July 2018.  She turned out to have stage IA disease and did not require adjuvant therapy.  She is being followed by Dr. Julien Nordmann.  She saw him last week.  She is doing well with no evidence of recurrent disease of the year.  I am also continue to follow her for her ascending aneurysm.  She denies any chest pain, pressure, or tightness.  Past Medical History:  Diagnosis Date  . Aneurysm ([place])    aortic   . AV block, complete ([place])    PPM placed 07/2011 [facility]  . Headache    migraines  . Heart murmur   . History of prolapse of bladder   . HLD (hyperlipidemia)   . HTN (hypertension)   . Hypothyroidism     no rx  . Lung cancer ([place]) dx'd 06/2017  . Pacemaker 12  . Pulmonary nodule, left 04/22/2017   From order notes     Current Outpatient Medications  Medication Sig Dispense Refill  . amLODipine-benazepril (LOTREL) 10-20 MG per capsule Take 1 capsule by mouth daily.      . cholecalciferol (VITAMIN D) 1000 UNITS tablet Take 2,000 Units by mouth daily.     . Cyanocobalamin (B-12) 3000 MCG CAPS Take 3,000 mcg by mouth daily.    . pravastatin (PRAVACHOL) 20 MG tablet Take 20 mg by mouth at bedtime.     . triamterene-hydrochlorothiazide (MAXZIDE-25) 37.5-25 MG per tablet Take 1 tablet by mouth daily.      Marland Kitchen azelastine (ASTELIN) 137 MCG/SPRAY nasal spray Place 1 spray into both nostrils daily as needed for allergies.      No current facility-administered medications  for this visit.     Physical Exam BP 132/83 (BP Location: Right Arm, Patient Position: Sitting, Cuff Size: Normal)   Pulse 80   Resp 16   Ht 5\' 4"  (1.626 m)   Wt 139 lb (63 kg)   SpO2 97% Comment: ON RA  BMI 23.31 kg/m  77 year old woman in no acute distress Alert and oriented x2 no focal deficits No cervical or supraclavicular adenopathy Cardiac regular rate and rhythm normal S1 and S2 Lungs clear with equal breath sounds bilaterally Incisions well-healed  Diagnostic Tests: CT CHEST WITH CONTRAST  TECHNIQUE: Multidetector CT imaging of the chest was performed during intravenous contrast administration.  CONTRAST:  52mL OMNIPAQUE IOHEXOL 300 MG/ML  SOLN  COMPARISON:  02/08/2018 chest CT.  FINDINGS: Cardiovascular: Normal heart size. No significant pericardial effusion/thickening. Two lead left subclavian pacemaker with lead tips in the right atrium and right ventricular apex. Atherosclerotic thoracic aorta with stable 4.8 cm ascending thoracic aortic aneurysm using similar measurement technique. Normal caliber pulmonary arteries. No central pulmonary emboli.  Mediastinum/Nodes: Stable hypodense 1.3 cm inferior left thyroid lobe nodule. Unremarkable esophagus. No pathologically enlarged axillary, mediastinal or hilar lymph nodes.  Lungs/Pleura: No pneumothorax. No pleural effusion. Status post left lower lobe  wedge resection. Stable mildly thickened curvilinear parenchymal band along suture line in the left lower lobe, compatible with postsurgical scarring. No acute consolidative airspace disease, lung masses or significant pulmonary nodules.  Upper abdomen: Simple 1.3 cm liver cyst adjacent to the gallbladder fossa is stable. A few scattered subcentimeter hypodense liver lesions are too small to characterize and are unchanged, probably benign. Cholelithiasis.  Musculoskeletal: No aggressive appearing focal osseous lesions. Mild thoracic  spondylosis.  IMPRESSION: 1. No evidence of local tumor recurrence in the left lower lobe status post wedge resection. No findings of metastatic disease in the chest. 2. Stable 4.8 cm ascending thoracic aortic aneurysm. Ascending thoracic aortic aneurysm. Recommend semi-annual imaging followup by CTA or MRA and referral to cardiothoracic surgery if not already obtained. This recommendation follows 2010 ACCF/AHA/AATS/ACR/ASA/SCA/SCAI/SIR/STS/SVM Guidelines for the Diagnosis and Management of Patients With Thoracic Aortic Disease. Circulation. 2010; 121: G902-X115. 3. Cholelithiasis.  Aortic Atherosclerosis (ICD10-I70.0).   Electronically Signed   By: Ilona Sorrel M.D.   On: 08/13/2018 15:17 I personally reviewed the CT images and concur with the findings noted above.  Although measurements are different from scan to scan the aneurysm remains unchanged  Impression: Katrina Hunter is a 77 year old woman with history of hypertension, hyperlipidemia, aortic atherosclerosis, and ascending aneurysm, and stage IA adenocarcinoma of the left lower lobe resected a year ago.  Adenocarcinoma lung-stage IA.  Did not require adjuvant therapy.  No evidence of disease at one year.  Aortic atherosclerosis/ascending aneurysm-stable at about 4.7 cm in greatest diameter.  The aneurysm is essentially unchanged over the past 5 years despite a significant spread in the reported measurements due to various radiologist reading exams.  Needs continued 36-month follow-up  Hypertension-blood pressure well controlled.  Plan: Return in 6 months after CT chest  Melrose Nakayama, MD [facility] [phone]

## 2018-10-19 IMAGING — MG MM DIGITAL SCREENING BILAT W/ TOMO W/ CAD
9 of 12 series · 9 of 28 positions shown · non-contrast
Comparison: Previous exam(s).

CLINICAL DATA: Screening.

EXAM:
2D DIGITAL SCREENING BILATERAL MAMMOGRAM WITH CAD AND ADJUNCT TOMO

[L MLO]
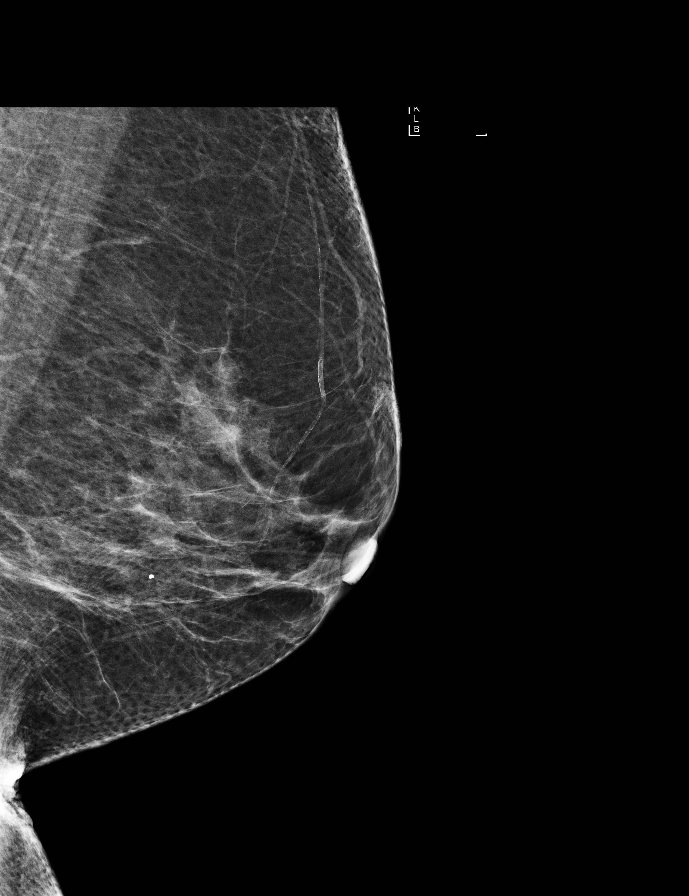

[R CC synth-2D]
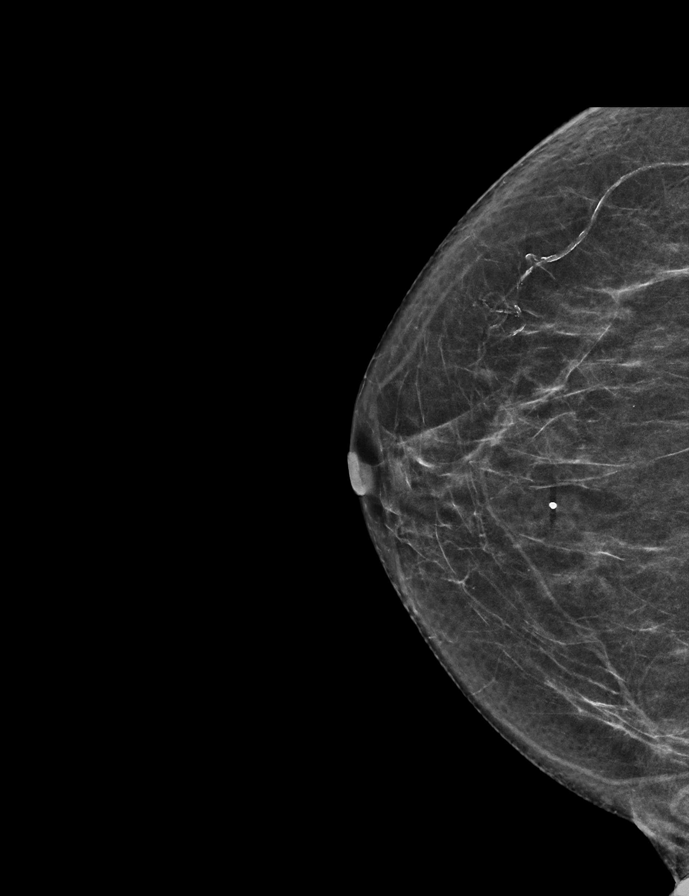

[L MLO synth-2D]
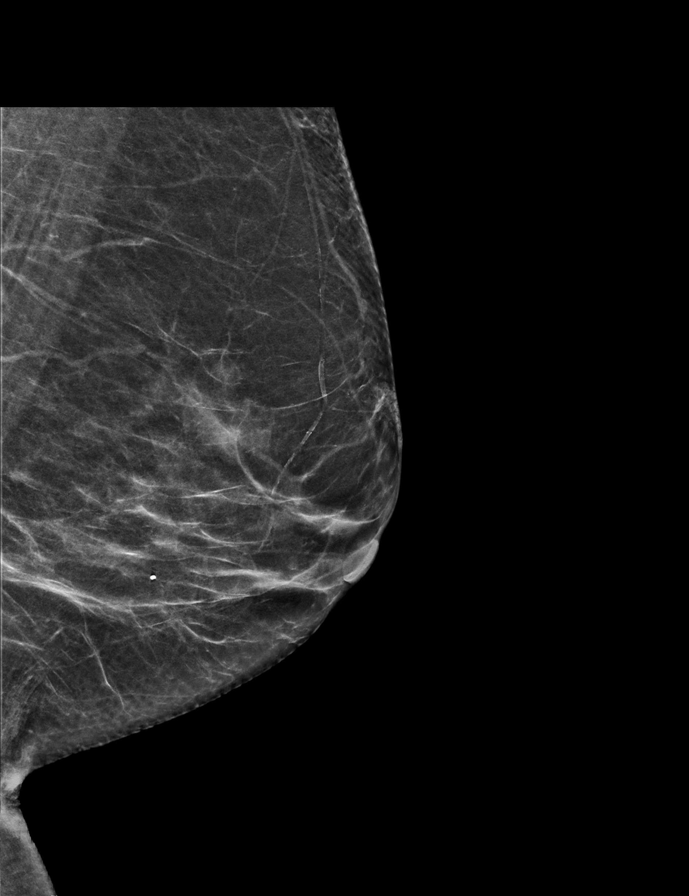

[R MLO]
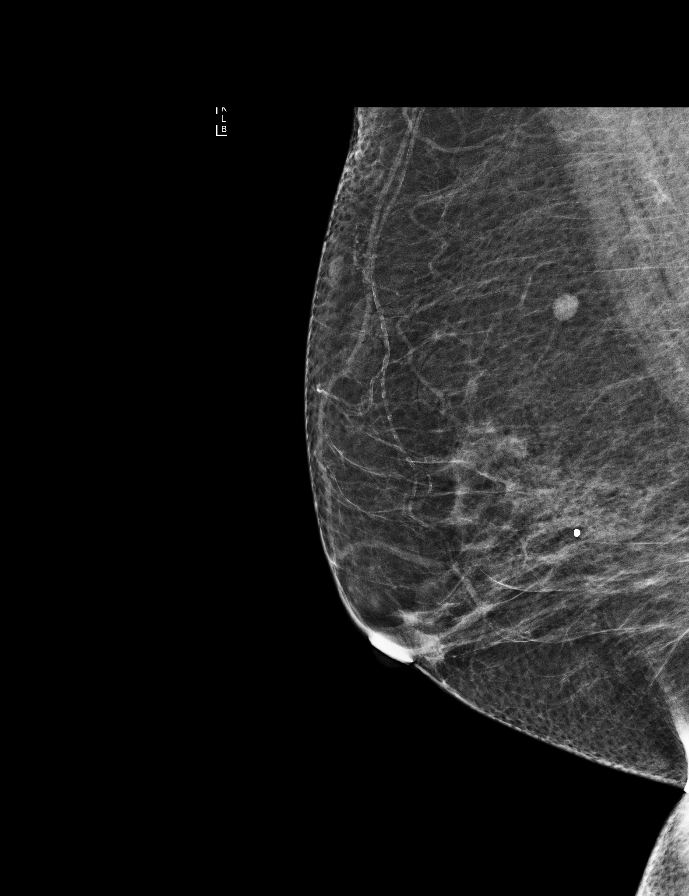

[L CC]
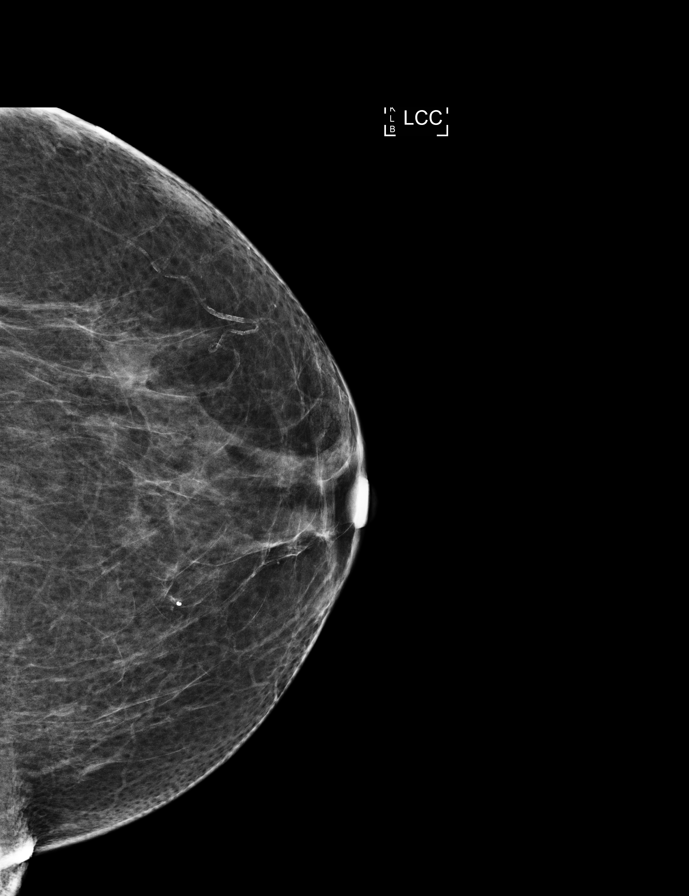

[R MLO synth-2D]
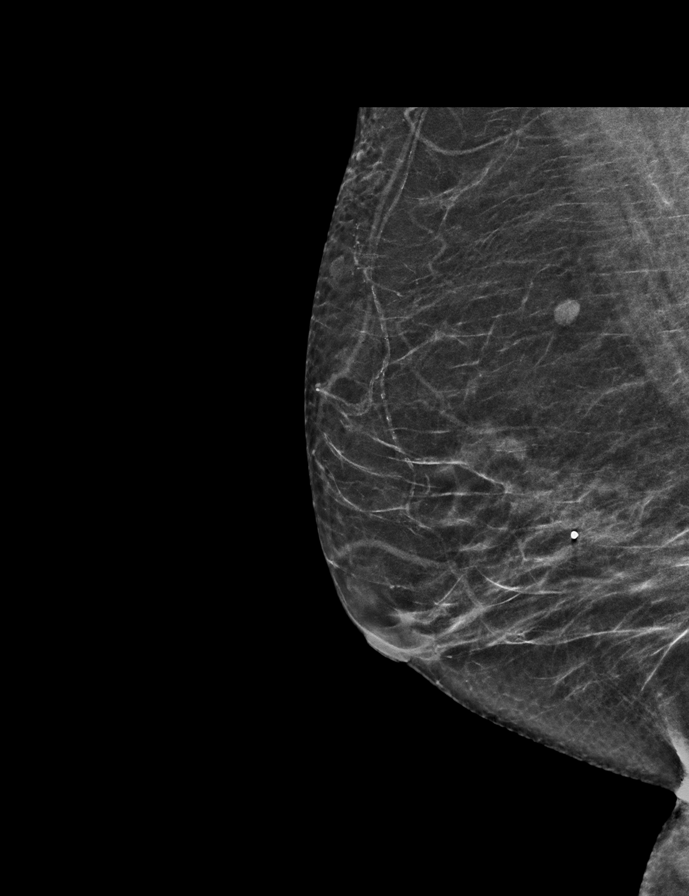

[L CC synth-2D]
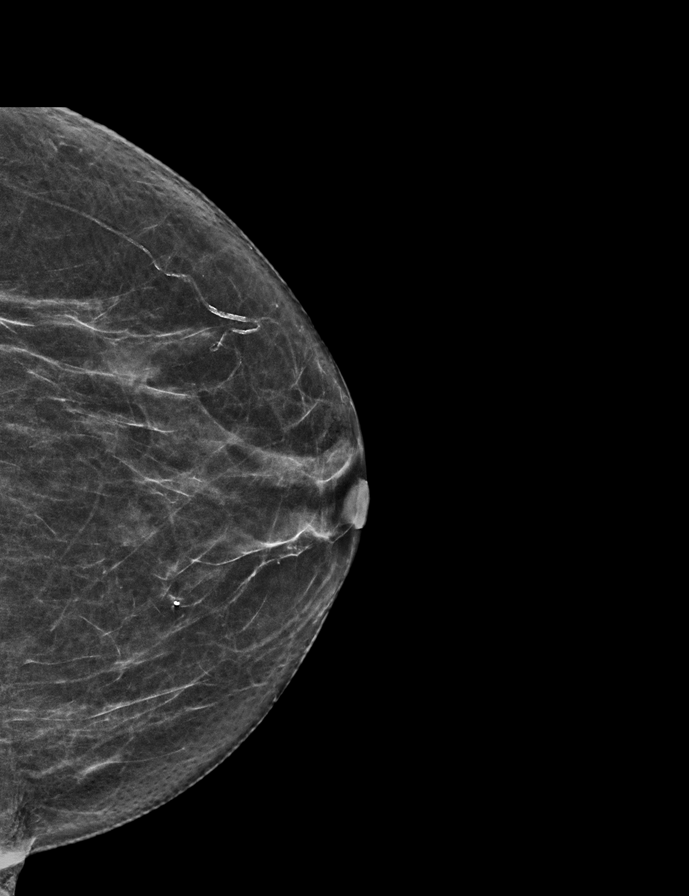

[R CC]
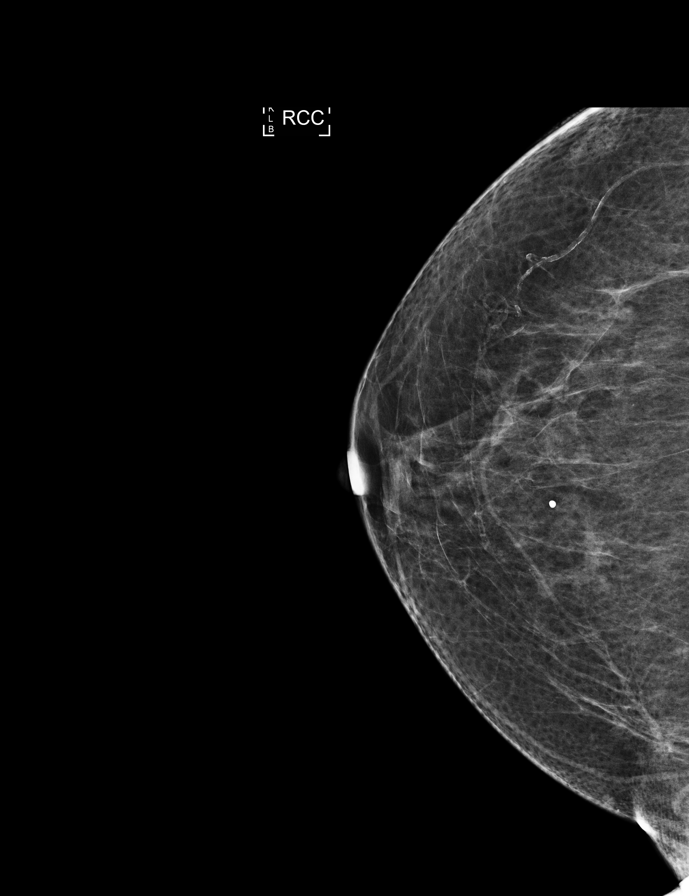

[L MLO tomo · tomo slice 29/56.0]
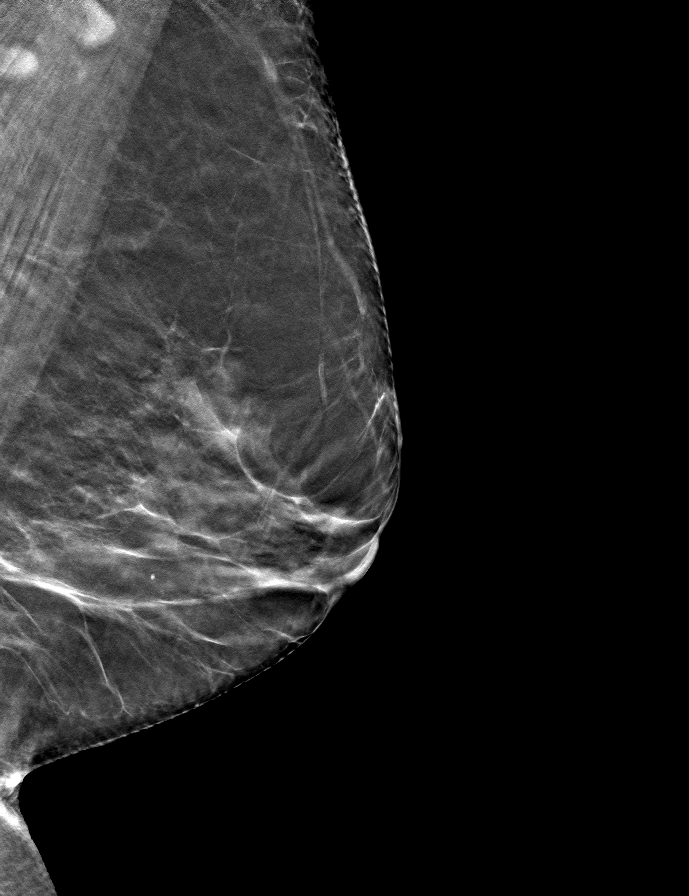

[9 of 28 positions shown; findings below may reference images not displayed]

ACR Breast Density Category b: There are scattered areas of
fibroglandular density.
FINDINGS: There are no findings suspicious for malignancy. Images were
processed with CAD.
IMPRESSION: No mammographic evidence of malignancy. A result letter of this
screening mammogram will be mailed directly to the patient.

RECOMMENDATION:
Screening mammogram in one year. (Code:97-6-RS4)

BI-RADS CATEGORY  1: Negative.

## 2018-11-15 ENCOUNTER — Ambulatory Visit: Payer: Medicare Other | Admitting: Cardiology

## 2018-12-07 ENCOUNTER — Ambulatory Visit (INDEPENDENT_AMBULATORY_CARE_PROVIDER_SITE_OTHER): Payer: Medicare Other

## 2018-12-07 DIAGNOSIS — I495 Sick sinus syndrome: Secondary | ICD-10-CM

## 2018-12-09 NOTE — Progress Notes (Signed)
Remote pacemaker transmission.   

## 2019-01-16 LAB — CUP PACEART REMOTE DEVICE CHECK
Battery Impedance: 2230 Ohm
Battery Remaining Longevity: 25 mo
Battery Voltage: 2.75 V
Brady Statistic AP VP Percent: 35 %
Brady Statistic AP VS Percent: 0 %
Brady Statistic AS VP Percent: 65 %
Implantable Lead Location: 753859
Implantable Lead Model: 5076
Implantable Lead Model: 5076
Lead Channel Pacing Threshold Amplitude: 0.5 V
Lead Channel Pacing Threshold Pulse Width: 0.4 ms
Lead Channel Pacing Threshold Pulse Width: 0.4 ms
Lead Channel Setting Pacing Pulse Width: 0.4 ms
MDC IDC LEAD IMPLANT DT: 20120816
MDC IDC LEAD IMPLANT DT: 20120816
MDC IDC LEAD LOCATION: 753860
MDC IDC MSMT LEADCHNL RA IMPEDANCE VALUE: 640 Ohm
MDC IDC MSMT LEADCHNL RV IMPEDANCE VALUE: 518 Ohm
MDC IDC MSMT LEADCHNL RV PACING THRESHOLD AMPLITUDE: 0.75 V
MDC IDC PG IMPLANT DT: 20120816
MDC IDC SESS DTM: 20191210163233
MDC IDC SET LEADCHNL RA PACING AMPLITUDE: 2 V
MDC IDC SET LEADCHNL RV PACING AMPLITUDE: 2.5 V
MDC IDC SET LEADCHNL RV SENSING SENSITIVITY: 2 mV
MDC IDC STAT BRADY AS VS PERCENT: 0 %

## 2019-02-11 ENCOUNTER — Ambulatory Visit: Payer: Medicare Other | Admitting: Internal Medicine

## 2019-02-27 ENCOUNTER — Other Ambulatory Visit: Payer: Self-pay

## 2019-02-27 ENCOUNTER — Encounter: Payer: Self-pay | Admitting: Emergency Medicine

## 2019-02-27 ENCOUNTER — Emergency Department: Payer: Medicare Other

## 2019-02-27 ENCOUNTER — Emergency Department
Admission: EM | Admit: 2019-02-27 | Discharge: 2019-02-27 | Disposition: A | Discharge status: Home / Self Care | Payer: Medicare Other | Attending: Emergency Medicine | Admitting: Emergency Medicine

## 2019-02-27 DIAGNOSIS — Z79899 Other long term (current) drug therapy: Secondary | ICD-10-CM | POA: Insufficient documentation

## 2019-02-27 DIAGNOSIS — E039 Hypothyroidism, unspecified: Secondary | ICD-10-CM | POA: Insufficient documentation

## 2019-02-27 DIAGNOSIS — Z85118 Personal history of other malignant neoplasm of bronchus and lung: Secondary | ICD-10-CM | POA: Insufficient documentation

## 2019-02-27 DIAGNOSIS — M25562 Pain in left knee: Secondary | ICD-10-CM

## 2019-02-27 DIAGNOSIS — I10 Essential (primary) hypertension: Secondary | ICD-10-CM | POA: Insufficient documentation

## 2019-02-27 DIAGNOSIS — Z950 Presence of cardiac pacemaker: Secondary | ICD-10-CM | POA: Diagnosis not present

## 2019-02-27 NOTE — ED Triage Notes (Signed)
Pt presents to ED via POV with c/o L leg pain that started today. Pt states when sitting pain from posterior knee to top of thigh, when standing, pain radiates from top of posterior thigh to ankle. Pt states difficulty standing due to pain.

## 2019-02-27 NOTE — Discharge Instructions (Addendum)
Follow-up with your regular orthopedic doctor.  Please call for an appointment.  Return emergency department for worsening.  Take Tylenol and ibuprofen.  Apply ice to the left knee.  Wear the knee immobilizer and use the walker for support.

## 2019-02-27 NOTE — ED Notes (Addendum)
Pt presents for Leg pain. Pt states pain varies in the area whether pt is standing or sitting. Pt states it is hard to walk r/t pain .

## 2019-02-27 NOTE — ED Notes (Signed)
AAOx3.  Skin warm and dry.  NAD 

## 2019-02-27 NOTE — ED Provider Notes (Signed)
[facility] Emergency Department Provider Note  ____________________________________________   First MD Initiated Contact with Patient 02/27/19 1429     (approximate)  I have reviewed the triage vital signs and the nursing notes.   HISTORY  Chief Complaint Leg Pain    HPI Katrina Hunter is a 78 y.o. female presents emergency department complaining of left knee pain.  She states that she has had pain behind the knee for the past week but now the pain radiates into the posterior thigh and ankle.  She states she was trying to get in the car and it became worse.  She denies any chest pain or shortness of breath.    Past Medical History:  Diagnosis Date  . Aneurysm ([place])    aortic   . AV block, complete ([place])    PPM placed 07/2011 [facility]  . Headache    migraines  . Heart murmur   . History of prolapse of bladder   . HLD (hyperlipidemia)   . HTN (hypertension)   . Hypothyroidism     no rx  . Lung cancer ([place]) dx'd 06/2017  . Pacemaker 12  . Pulmonary nodule, left 04/22/2017   From order notes    Patient Active Problem List   Diagnosis Date Noted  . Thoracic ascending aortic aneurysm ([place]) 09/29/2017  . Thoracic aortic atherosclerosis ([place]) 09/29/2017  . Adenocarcinoma of left lung, stage 1 ([place]) 08/08/2017  . Lung cancer ([place]) 06/29/2017  . Pulmonary nodule, left 08/19/2011  . AV block, complete ([facility])   . HTN (hypertension)   . HLD (hyperlipidemia)   . Hypothyroidism   . Complete heart block ([place]) 08/07/2011  . Hypertension 08/07/2011    Past Surgical History:  Procedure Laterality Date  . BLADDER SURGERY  01/03/2017  . LYMPH NODE DISSECTION Left 06/29/2017   Procedure: LYMPH NODE DISSECTION LEFT LUNG;  Surgeon: Melrose Nakayama, MD;  Location: [facility];  Service: Thoracic;  Laterality: Left;  . PACEMAKER INSERTION  07/2011   [name]  . TONSILLECTOMY  age 52  . TUBAL LIGATION    . VIDEO ASSISTED THORACOSCOPY (VATS)/WEDGE RESECTION Left  06/29/2017   Procedure: VIDEO ASSISTED THORACOSCOPY (VATS)/WEDGE RESECTION;  Surgeon: Melrose Nakayama, MD;  Location: [facility];  Service: Thoracic;  Laterality: Left;  Marland Kitchen VIDEO BRONCHOSCOPY WITH ENDOBRONCHIAL NAVIGATION N/A 12/05/2013   Procedure: VIDEO BRONCHOSCOPY WITH ENDOBRONCHIAL NAVIGATION;  Surgeon: Collene Gobble, MD;  Location: [facility];  Service: Thoracic;  Laterality: N/A;    Prior to Admission medications   Medication Sig Start Date End Date Taking? Authorizing Provider  amLODipine-benazepril (LOTREL) 10-20 MG per capsule Take 1 capsule by mouth daily.      [provider]  azelastine (ASTELIN) 137 MCG/SPRAY nasal spray Place 1 spray into both nostrils daily as needed for allergies.     [provider]  cholecalciferol (VITAMIN D) 1000 UNITS tablet Take 2,000 Units by mouth daily.     [provider]  Cyanocobalamin (B-12) 3000 MCG CAPS Take 3,000 mcg by mouth daily.    [provider]  pravastatin (PRAVACHOL) 20 MG tablet Take 20 mg by mouth at bedtime.     [provider]  triamterene-hydrochlorothiazide (MAXZIDE-25) 37.5-25 MG per tablet Take 1 tablet by mouth daily.      [provider]    Allergies Crestor [rosuvastatin calcium] and Sulfonamide derivatives  Family History  Problem Relation Age of Onset  . Heart disease Father   . Breast cancer Mother 25  Social History Social History   Tobacco Use  . Smoking status: Never Smoker  . Smokeless tobacco: Never Used  Substance Use Topics  . Alcohol use: No  . Drug use: No    Review of Systems  Constitutional: No fever/chills Eyes: No visual changes. ENT: No sore throat. Respiratory: Denies cough Genitourinary: Negative for dysuria. Musculoskeletal: Negative for back pain.  Positive for left knee pain Skin: Negative for rash.    ____________________________________________   PHYSICAL EXAM:  VITAL SIGNS: ED Triage Vitals  Enc Vitals Group     BP  02/27/19 1357 (!) 142/74     Pulse Rate 02/27/19 1357 (!) 101     Resp 02/27/19 1357 18     Temp 02/27/19 1357 98.3 F (36.8 C)     Temp Source 02/27/19 1357 Oral     SpO2 02/27/19 1357 94 %     Weight 02/27/19 1358 137 lb (62.1 kg)     Height 02/27/19 1358 5\' 4"  (1.626 m)     Head Circumference --      Peak Flow --      Pain Score 02/27/19 1358 10     Pain Loc --      Pain Edu? --      Excl. in [place]? --     Constitutional: Alert and oriented. Well appearing and in no acute distress. Eyes: Conjunctivae are normal.  Head: Atraumatic. Nose: No congestion/rhinnorhea. Mouth/Throat: Mucous membranes are moist.   Neck:  supple no lymphadenopathy noted Cardiovascular: Normal rate, regular rhythm. Heart sounds are normal Respiratory: Normal respiratory effort.  No retractions, lungs c t a  GU: deferred Musculoskeletal: Left knee is tender at the posterior.  Several venous knots are noted.  Neurovascular is intact.  Neurologic:  Normal speech and language.  Skin:  Skin is warm, dry and intact. No rash noted. Psychiatric: Mood and affect are normal. Speech and behavior are normal.  ____________________________________________   LABS (all labs ordered are listed, but only abnormal results are displayed)  Labs Reviewed - No data to display ____________________________________________   ____________________________________________  RADIOLOGY  Ultrasound of the left lower leg is negative for DVT or Baker's cyst  ____________________________________________   PROCEDURES  Procedure(s) performed: Knee immobilizer and walker  Procedures    ____________________________________________   INITIAL IMPRESSION / ASSESSMENT AND PLAN / ED COURSE  Pertinent labs & imaging results that were available during my care of the patient were reviewed by me and considered in my medical decision making (see chart for details).   Patient 78 year old female presents emergency department  complaining of posterior and ear pain.  No bony tenderness.  She states is just behind the knee like a nerve.  Physical exam shows that the posterior knee is tender to palpation.  No bony tenderness is noted.  Full range of motion.  Pain is reproduced with bearing weight.  Ultrasound of the left lower leg for DVT is negative.  Patient states she does not feel she needs x-rays at this time as she does not feel it is a bone problem.  I agree with her.  I explained to her that a soft tissue problem could be helped with a knee immobilizer.  I explained to her on a 1 to put her on crutches as this may make her fall and break a hip.  She states she understands and was given a walker.  She is to take over-the-counter Tylenol or ibuprofen.  Follow-up with her regular doctor.  She states she  understands and will comply.  She was discharged in stable condition.     As part of my medical decision making, I reviewed the following data within the [place] notes reviewed and incorporated, Old chart reviewed, Radiograph reviewed sound of the left leg is negative for DVT, Notes from prior ED visits and [place] Controlled Substance Database  ____________________________________________   FINAL CLINICAL IMPRESSION(S) / ED DIAGNOSES  Final diagnoses:  Acute pain of left knee      NEW MEDICATIONS STARTED DURING THIS VISIT:  Discharge Medication List as of 02/27/2019  4:48 PM       Note:  This document was prepared using Dragon voice recognition software and may include unintentional dictation errors.    Versie Starks, PA-C 02/27/19 1841    Eula Listen, MD 02/27/19 2008

## 2019-02-28 ENCOUNTER — Telehealth: Payer: Self-pay | Admitting: Internal Medicine

## 2019-02-28 ENCOUNTER — Other Ambulatory Visit
Admission: RE | Admit: 2019-02-28 | Discharge: 2019-02-28 | Disposition: A | Discharge status: Home / Self Care | Payer: Medicare Other | Source: Ambulatory Visit | Attending: Orthopedic Surgery | Admitting: Orthopedic Surgery

## 2019-02-28 DIAGNOSIS — M25462 Effusion, left knee: Secondary | ICD-10-CM | POA: Insufficient documentation

## 2019-02-28 LAB — SYNOVIAL CELL COUNT + DIFF, W/ CRYSTALS
CRYSTALS FLUID: NONE SEEN
EOSINOPHILS-SYNOVIAL: 0 %
Lymphocytes-Synovial Fld: 2 %
MONOCYTE-MACROPHAGE-SYNOVIAL FLUID: 52 %
Neutrophil, Synovial: 46 %
OTHER CELLS-SYN: 0
WBC, Synovial: 440 /mm3 — ABNORMAL HIGH (ref 0–200)

## 2019-02-28 NOTE — Telephone Encounter (Addendum)
Called pt per 3/2 sch message - unable to reach pt and unable to leave message to r/s 3/3 appt.

## 2019-03-01 ENCOUNTER — Telehealth (HOSPITAL_COMMUNITY): Payer: Self-pay

## 2019-03-01 ENCOUNTER — Ambulatory Visit (HOSPITAL_COMMUNITY): Payer: Medicare Other

## 2019-03-01 ENCOUNTER — Telehealth: Payer: Self-pay | Admitting: Medical Oncology

## 2019-03-01 ENCOUNTER — Inpatient Hospital Stay: Payer: Medicare Other | Attending: Internal Medicine

## 2019-03-01 NOTE — Telephone Encounter (Addendum)
I left vm to return call to r/s appts . I moved labs and CT to this Friday . Needs appt with [facility] the following week. Schedule message sent.

## 2019-03-03 ENCOUNTER — Ambulatory Visit: Payer: Medicare Other | Admitting: Internal Medicine

## 2019-03-03 NOTE — Telephone Encounter (Addendum)
Made On: Canceled: 09/02/2018 1:00 PM 03/01/2019 2:34 PM By: By: Cecilie Lowers, Gauri Galvao H  Cancel Rsn: Patient (note states pt will call back to r/s)    On call service note.

## 2019-03-04 LAB — BODY FLUID CULTURE: Culture: NO GROWTH

## 2019-03-08 ENCOUNTER — Ambulatory Visit (INDEPENDENT_AMBULATORY_CARE_PROVIDER_SITE_OTHER): Payer: Medicare Other | Admitting: *Deleted

## 2019-03-08 ENCOUNTER — Encounter: Payer: Medicare Other | Admitting: Thoracic Surgery (Cardiothoracic Vascular Surgery)

## 2019-03-08 DIAGNOSIS — I495 Sick sinus syndrome: Secondary | ICD-10-CM

## 2019-03-09 LAB — CUP PACEART REMOTE DEVICE CHECK
Battery Voltage: 2.74 V
Date Time Interrogation Session: 20200310124639
Implantable Lead Implant Date: 20120816
Implantable Lead Location: 753860
Implantable Lead Model: 5076
Implantable Lead Model: 5076
Implantable Pulse Generator Implant Date: 20120816
Lead Channel Impedance Value: 568 Ohm
Lead Channel Pacing Threshold Pulse Width: 0.4 ms
Lead Channel Setting Pacing Amplitude: 2.5 V
Lead Channel Setting Pacing Pulse Width: 0.4 ms
Lead Channel Setting Sensing Sensitivity: 2 mV
MDC IDC LEAD IMPLANT DT: 20120816
MDC IDC LEAD LOCATION: 753859
MDC IDC MSMT BATTERY IMPEDANCE: 2252 Ohm
MDC IDC MSMT BATTERY REMAINING LONGEVITY: 25 mo
MDC IDC MSMT LEADCHNL RA PACING THRESHOLD AMPLITUDE: 0.5 V
MDC IDC MSMT LEADCHNL RV IMPEDANCE VALUE: 450 Ohm
MDC IDC MSMT LEADCHNL RV PACING THRESHOLD AMPLITUDE: 0.75 V
MDC IDC MSMT LEADCHNL RV PACING THRESHOLD PULSEWIDTH: 0.4 ms
MDC IDC SET LEADCHNL RA PACING AMPLITUDE: 2 V
MDC IDC STAT BRADY AP VP PERCENT: 35 %
MDC IDC STAT BRADY AP VS PERCENT: 0 %
MDC IDC STAT BRADY AS VP PERCENT: 65 %
MDC IDC STAT BRADY AS VS PERCENT: 0 %

## 2019-03-15 ENCOUNTER — Encounter: Payer: Self-pay | Admitting: Cardiology

## 2019-03-15 NOTE — Progress Notes (Signed)
Remote pacemaker transmission.   

## 2019-03-28 ENCOUNTER — Telehealth: Payer: Self-pay

## 2019-03-28 NOTE — Telephone Encounter (Signed)
Virtual Visit Pre-Appointment Phone Call  Steps For Call:  1. Confirm consent - "In the setting of the current Covid19 crisis, you are scheduled for a TELEPHONE visit with your provider on 04/04/2019 at 2:00pm.  Just as we do with many in-office visits, in order for you to participate in this visit, we must obtain consent.  If you'd like, I can send this to your mychart (if signed up) or email for you to review.  Otherwise, I can obtain your verbal consent now.  All virtual visits are billed to your insurance company just like a normal visit would be.  By agreeing to a virtual visit, we'd like you to understand that the technology does not allow for your provider to perform an examination, and thus may limit your provider's ability to fully assess your condition.  Finally, though the technology is pretty good, we cannot assure that it will always work on either your or our end, and in the setting of a video visit, we may have to convert it to a phone-only visit.  In either situation, we cannot ensure that we have a secure connection.  Are you willing to proceed?"  2. Give patient instructions for WebEx download to smartphone as below if video visit  3. Advise patient to be prepared with any vital sign or heart rhythm information, their current medicines, and a piece of paper and pen handy for any instructions they may receive the day of their visit  4. Inform patient they will receive a phone call 15 minutes prior to their appointment time (may be from unknown caller ID) so they should be prepared to answer  5. Confirm that appointment type is correct in Epic appointment notes (video vs telephone)    TELEPHONE CALL NOTE  Katrina Hunter has been deemed a candidate for a follow-up tele-health visit to limit community exposure during the Covid-19 pandemic. I spoke with the patient via phone to ensure availability of phone/video source, confirm preferred email & phone number, and discuss  instructions and expectations.  I reminded Katrina Hunter to be prepared with any vital sign and/or heart rhythm information that could potentially be obtained via home monitoring, at the time of her visit. I reminded Katrina Hunter to expect a phone call at the time of her visit if her visit.  Did the patient verbally acknowledge consent to treatment? [place], [place] 03/28/2019 3:38 PM  CONSENT FOR TELE-HEALTH VISIT - PLEASE REVIEW  I hereby voluntarily request, consent and authorize [facility] and its employed or contracted physicians, physician assistants, nurse practitioners or other licensed health care professionals (the Practitioner), to provide me with telemedicine health care services (the Services") as deemed necessary by the treating Practitioner. I acknowledge and consent to receive the Services by the Practitioner via telemedicine. I understand that the telemedicine visit will involve communicating with the Practitioner through live audiovisual communication technology and the disclosure of certain medical information by electronic transmission. I acknowledge that I have been given the opportunity to request an in-person assessment or other available alternative prior to the telemedicine visit and am voluntarily participating in the telemedicine visit.  I understand that I have the right to withhold or withdraw my consent to the use of telemedicine in the course of my care at any time, without affecting my right to future care or treatment, and that the Practitioner or I may terminate the telemedicine visit at any time. I understand that I have  the right to inspect all information obtained and/or recorded in the course of the telemedicine visit and may receive copies of available information for a reasonable fee.  I understand that some of the potential risks of receiving the Services via telemedicine include:   Delay or interruption in medical evaluation due to  technological equipment failure or disruption;  Information transmitted may not be sufficient (e.g. poor resolution of images) to allow for appropriate medical decision making by the Practitioner; and/or   In rare instances, security protocols could fail, causing a breach of personal health information.  Furthermore, I acknowledge that it is my responsibility to provide information about my medical history, conditions and care that is complete and accurate to the best of my ability. I acknowledge that Practitioner's advice, recommendations, and/or decision may be based on factors not within their control, such as incomplete or inaccurate data provided by me or distortions of diagnostic images or specimens that may result from electronic transmissions. I understand that the practice of medicine is not an exact science and that Practitioner makes no warranties or guarantees regarding treatment outcomes. I acknowledge that I will receive a copy of this consent concurrently upon execution via email to the email address I last provided but may also request a printed copy by calling the office of [place].    I understand that my insurance will be billed for this visit.   I have read or had this consent read to me.  I understand the contents of this consent, which adequately explains the benefits and risks of the Services being provided via telemedicine.   I have been provided ample opportunity to ask questions regarding this consent and the Services and have had my questions answered to my satisfaction.  I give my informed consent for the services to be provided through the use of telemedicine in my medical care  By participating in this telemedicine visit I agree to the above.

## 2019-04-04 ENCOUNTER — Other Ambulatory Visit: Payer: Self-pay

## 2019-04-04 ENCOUNTER — Telehealth (INDEPENDENT_AMBULATORY_CARE_PROVIDER_SITE_OTHER): Payer: Medicare Other | Admitting: Cardiovascular Disease

## 2019-04-04 ENCOUNTER — Encounter: Payer: Self-pay | Admitting: Cardiovascular Disease

## 2019-04-04 VITALS — BP 115/73 | HR 73 | Wt 136.0 lb

## 2019-04-04 DIAGNOSIS — I700 Atherosclerosis of aorta: Secondary | ICD-10-CM | POA: Diagnosis not present

## 2019-04-04 DIAGNOSIS — Z950 Presence of cardiac pacemaker: Secondary | ICD-10-CM

## 2019-04-04 DIAGNOSIS — I10 Essential (primary) hypertension: Secondary | ICD-10-CM | POA: Diagnosis not present

## 2019-04-04 DIAGNOSIS — I442 Atrioventricular block, complete: Secondary | ICD-10-CM | POA: Diagnosis not present

## 2019-04-04 DIAGNOSIS — E782 Mixed hyperlipidemia: Secondary | ICD-10-CM

## 2019-04-04 DIAGNOSIS — C3492 Malignant neoplasm of unspecified part of left bronchus or lung: Secondary | ICD-10-CM | POA: Diagnosis not present

## 2019-04-04 DIAGNOSIS — I7121 Aneurysm of the ascending aorta, without rupture: Secondary | ICD-10-CM

## 2019-04-04 DIAGNOSIS — I712 Thoracic aortic aneurysm, without rupture: Secondary | ICD-10-CM

## 2019-04-04 NOTE — Progress Notes (Signed)
Is this for in office or Virtual

## 2019-04-04 NOTE — Progress Notes (Signed)
Would have to be virtual- Dr. Caryl Comes will not seen any in office until the COVID restrictions are lifted. Thanks!

## 2019-04-04 NOTE — Progress Notes (Addendum)
Virtual Visit via Telephone Note   This visit type was conducted due to national recommendations for restrictions regarding the COVID-19 Pandemic (e.g. social distancing) in an effort to limit this patient's exposure and mitigate transmission in our community.  Due to her co-morbid illnesses, this patient is at least at moderate risk for complications without adequate follow up.  This format is felt to be most appropriate for this patient at this time.  The patient did not have access to video technology/had technical difficulties with video requiring transitioning to audio format only (telephone).  All issues noted in this document were discussed and addressed.  No physical exam could be performed with this format.  Please refer to the patient's chart for her  consent to telehealth for [facility].    Date:  04/04/2019   ID:  Katrina Hunter, DOB [birth year], MRN [id number]  Patient Location:  Katrina [place] [id number]   Provider location:   [facility], [place] office  PCP:  [name], MD  Cardiologist:  Arvid Right [facility]  Chief Complaint: Consultation for her dilated ascending aorta, pacemaker  New patient visit Webcam used for visit  History of Present Illness:    Katrina Hunter is a 78 y.o. female who presents via audio/video conferencing for a telehealth visit today.   The patient does not symptoms concerning for COVID-19 infection (fever, chills, cough, or new SHORTNESS OF BREATH).   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 Hypertension,  hyperlipidemia Aneurysm of ascending aorta Lung cancer adenocarcinoma surgery July 2018 Wedge resection Who presents to establish care in the [facility] office for her ascending aorta aneurysm, complete heart block and pacemaker  Needs pacer download 05/2019 Needs to establish with new EP  Aortic athero moderate on CT performed in 2019 Images pulled up in  the office today and discussed with her Notably in the arch, descending aorta  myalagia on crestor Changed to pravastatin, currently no side effects  1/2 mixzide pill daily Blood pressure was running low and she was getting dehydrated on a full pill  Lab work reviewed with her in detail Potassium low Typically 3.5 up to 3.7  CT scan results below discussed with her    Prior CV studies:   The following studies were reviewed today:  CT chest  07/2018 Stable 4.8 cm ascending thoracic aortic aneurysm. Ascending thoracic aortic aneurysm.  CT scan 2012: aorta dilation was 4.4 cm CT scan 20113: Aorta dilation 4.8 cm CT scan: 2014: 4.7 cm CT scan 11/2015: 5.1 x 4.7 cm CT scan 12/2016: 5.1 cm CT scan 01/2018 aorta dilation 4.7   Past Medical History:  Diagnosis Date  . Aneurysm ([place])    aortic   . AV block, complete ([place])    PPM placed 07/2011 [facility]  . Headache    migraines  . Heart murmur   . History of prolapse of bladder   . HLD (hyperlipidemia)   . HTN (hypertension)   . Hypothyroidism     no rx  . Lung cancer ([place]) dx'd 06/2017  . Pacemaker 12  . Pulmonary nodule, left 04/22/2017   From order notes   Past Surgical History:  Procedure Laterality Date  . BLADDER SURGERY  01/03/2017  . LYMPH NODE DISSECTION Left 06/29/2017   Procedure: LYMPH NODE DISSECTION LEFT LUNG;  Surgeon: Melrose Nakayama, MD;  Location: [place];  Service: Thoracic;  Laterality: Left;  . PACEMAKER INSERTION  07/2011   [name]  . TONSILLECTOMY  age 7  . TUBAL LIGATION    . VIDEO ASSISTED THORACOSCOPY (VATS)/WEDGE RESECTION Left 06/29/2017   Procedure: VIDEO ASSISTED THORACOSCOPY (VATS)/WEDGE RESECTION;  Surgeon: Melrose Nakayama, MD;  Location: [place];  Service: Thoracic;  Laterality: Left;  Marland Kitchen VIDEO BRONCHOSCOPY WITH ENDOBRONCHIAL NAVIGATION N/A 12/05/2013   Procedure: VIDEO BRONCHOSCOPY WITH ENDOBRONCHIAL NAVIGATION;  Surgeon: Collene Gobble, MD;  Location: [place];  Service: Thoracic;   Laterality: N/A;     Current Meds  Medication Sig  . amLODipine-benazepril (LOTREL) 10-20 MG per capsule Take 1 capsule by mouth daily.    Marland Kitchen azelastine (ASTELIN) 137 MCG/SPRAY nasal spray Place 1 spray into both nostrils daily as needed for allergies.   . cholecalciferol (VITAMIN D) 1000 UNITS tablet Take 2,000 Units by mouth daily.   . Cyanocobalamin (B-12) 3000 MCG CAPS Take 3,000 mcg by mouth daily.  . pravastatin (PRAVACHOL) 20 MG tablet Take 20 mg by mouth at bedtime.   . triamterene-hydrochlorothiazide (MAXZIDE-25) 37.5-25 MG per tablet Take 1 tablet by mouth daily.       Allergies:   Crestor [rosuvastatin calcium] and Sulfonamide derivatives   Social History   Tobacco Use  . Smoking status: Never Smoker  . Smokeless tobacco: Never Used  Substance Use Topics  . Alcohol use: No  . Drug use: No     Current Outpatient Medications on File Prior to Visit  Medication Sig Dispense Refill  . amLODipine-benazepril (LOTREL) 10-20 MG per capsule Take 1 capsule by mouth daily.      Marland Kitchen azelastine (ASTELIN) 137 MCG/SPRAY nasal spray Place 1 spray into both nostrils daily as needed for allergies.     . cholecalciferol (VITAMIN D) 1000 UNITS tablet Take 2,000 Units by mouth daily.     . Cyanocobalamin (B-12) 3000 MCG CAPS Take 3,000 mcg by mouth daily.    . pravastatin (PRAVACHOL) 20 MG tablet Take 20 mg by mouth at bedtime.     . triamterene-hydrochlorothiazide (MAXZIDE-25) 37.5-25 MG per tablet Take 1 tablet by mouth daily.       No current facility-administered medications on file prior to visit.      Family Hx: The patient's family history includes Breast cancer (age of onset: 49) in her mother; Heart disease in her father.  ROS:   Please see the history of present illness.    Review of Systems  Constitutional: Negative.   Respiratory: Negative.   Cardiovascular: Negative.   Gastrointestinal: Negative.   Musculoskeletal: Negative.   Neurological: Negative.    Psychiatric/Behavioral: Negative.   All other systems reviewed and are negative.     Labs/Other Tests and Data Reviewed:    Recent Labs: 08/13/2018: ALT 9; BUN 26; Creatinine 1.42; Hemoglobin 12.3; Platelet Count 261; Potassium 3.3; Sodium 141   Recent Lipid Panel No results found for: CHOL, TRIG, HDL, CHOLHDL, LDLCALC, LDLDIRECT  Wt Readings from Last 3 Encounters:  04/04/19 136 lb (61.7 kg)  02/27/19 137 lb (62.1 kg)  09/14/18 139 lb (63 kg)     Exam:    Vital Signs: Vital signs as detailed above in HPI  Well nourished, well developed female in no acute distress. Constitutional:  oriented to person, place, and time. No distress.  Head: Normocephalic and atraumatic.  Eyes:  no discharge. No scleral icterus.  Neck: Normal range of motion. Neck supple.  Pulmonary/Chest: No audible wheezing, no distress, appears comfortable Musculoskeletal: Normal range of motion.  no  tenderness or deformity.  Neurological:  Coordination normal. Full exam not performed Skin:  No rash Psychiatric:  normal mood and affect. behavior is normal. Thought content normal.    ASSESSMENT & PLAN:     Thoracic ascending aortic aneurysm (HCC) CT scan images pulled up and discussed with her Relatively stable size 4.8 cm over the past 5 years or more She is concerned about radiation from CT scans on a yearly basis It was even recommended in the past she do this every 6 months Reassurance provided  AV block, complete ([place]) - Plan: Ambulatory referral to Cardiac Electrophysiology Pacemaker in place Does downloads 4 times a year/quarterly Essential hypertension Blood pressure is well controlled on today's visit. No changes made to the medications.  Mixed hyperlipidemia Long discussion with her, need for LDL less than 70 given diffuse at least moderate aortic atherosclerosis She denies prior smoking, no diabetes She will wait for new lab work numbers before starting [place] Will help  transition to EP Consult placed  Adenocarcinoma of left lung, stage 1 ([place]) Reports she was non-smoker Had wedge resection  Complete heart block ([place]) - Plan: Ambulatory referral to Cardiac Electrophysiology Referral placed to establish care with Dr. Caryl Comes, EP for pacemaker Previously seen by Dr. Lovena Le 2012 for pacer placement  Aortic atherosclerosis Suggested she start Zetia 10 mg daily for goal LDL less than 70 She has moderate diffuse aortic atherosclerosis on CT scan She prefers to have repeat lipid panel with further discussion  COVID-19 Education: The signs and symptoms of COVID-19 were discussed with the patient and how to seek care for testing (follow up with PCP or arrange E-visit).  The importance of social distancing was discussed today.  Patient Risk:   After full review of this patients clinical status, I feel that they are at least moderate risk at this time.  Time:   Today, I have spent 25 minutes with the patient with telehealth technology discussing .     Medication Adjustments/Labs and Tests Ordered: Current medicines are reviewed at length with the patient today.  Concerns regarding medicines are outlined above.   Tests Ordered: No tests ordered   Medication Changes: No changes made   Disposition: Follow-up in 6 months   Signed, Ida Rogue, MD  04/04/2019 1:30 PM    [place] Office [address] [place] [address], [place], [place] [address] In

## 2019-04-04 NOTE — Patient Instructions (Addendum)
New appt with Dr. Olin Pia, new patient for pacer   Medication Instructions:  No changes  If you need a refill on your cardiac medications before your next appointment, please call your pharmacy.    Lab work: No new labs needed   If you have labs (blood work) drawn today and your tests are completely normal, you will receive your results only by: Marland Kitchen MyChart Message (if you have MyChart) OR . A paper copy in the mail If you have any lab test that is abnormal or we need to change your treatment, we will call you to review the results.   Testing/Procedures: No new testing needed   Follow-Up: At [facility], you and your health needs are our priority.  As part of our continuing mission to provide you with exceptional heart care, we have created designated Provider Care Teams.  These Care Teams include your primary Cardiologist (physician) and Advanced Practice Providers (APPs -  Physician Assistants and Nurse Practitioners) who all work together to provide you with the care you need, when you need it.  . You will need a follow up appointment in 6 months .   Please call our office 2 months in advance to schedule this appointment.    . Providers on your designated Care Team:   . Murray Hodgkins, NP . Christell Faith, PA-C . Marrianne Mood, PA-C  Any Other Special Instructions Will Be Listed Below (If Applicable).  For educational health videos Log in to : [email or web address] Or : [email or web address], password : [email or web address]

## 2019-04-11 ENCOUNTER — Telehealth (INDEPENDENT_AMBULATORY_CARE_PROVIDER_SITE_OTHER): Payer: Medicare Other | Admitting: Internal Medicine

## 2019-04-11 VITALS — Ht 64.0 in | Wt 136.0 lb

## 2019-04-11 DIAGNOSIS — I442 Atrioventricular block, complete: Secondary | ICD-10-CM

## 2019-04-11 DIAGNOSIS — Z950 Presence of cardiac pacemaker: Secondary | ICD-10-CM

## 2019-04-11 DIAGNOSIS — I10 Essential (primary) hypertension: Secondary | ICD-10-CM

## 2019-04-11 DIAGNOSIS — I495 Sick sinus syndrome: Secondary | ICD-10-CM

## 2019-04-11 NOTE — Progress Notes (Signed)
Electrophysiology TeleHealth Note   Due to national recommendations of social distancing due to COVID 19, an audio/video telehealth visit is felt to be most appropriate for this patient at this time.  See MyChart message from today for the patient's consent to telehealth for [facility].   Date:  04/11/2019   ID:  Katrina Hunter, DOB Apr 27, 1941, MRN [id number]  Location: patient's home  Provider location: [address], [place]  Evaluation Performed: Initial Evaluation  PCP:  [name], MD  Cardiologist:   Judith Part previously Electrophysiologist:  GT previously;  [place] pt now SK   Chief Complaint: to establish  History of Present Illness:    Katrina Hunter is a 78 y.o. female who presents via audio/video conferencing for a telehealth visit today to establish Pacemaker followup  Implanted for modestly symptomatic CHB 2012 (GT)       The patient denies chest pain, shortness of breath, nocturnal dyspnea, orthopnea or peripheral edema.  There have been no palpitations, lightheadedness or syncope.    Does not recall last echoo  No interval blood work   Date Cr K Hgb  8/19 1.42 3.3 12.3          The patient denies symptoms of fevers, chills, cough, or new SOB worrisome for COVID 19.    Past Medical History:  Diagnosis Date  . Aneurysm ([place])    aortic   . AV block, complete ([place])    PPM placed 07/2011 [facility]  . Headache    migraines  . Heart murmur   . History of prolapse of bladder   . HLD (hyperlipidemia)   . HTN (hypertension)   . Hypothyroidism     no rx  . Lung cancer ([place]) dx'd 06/2017  . Pacemaker 12  . Pulmonary nodule, left 04/22/2017   From order notes    Past Surgical History:  Procedure Laterality Date  . BLADDER SURGERY  01/03/2017  . LYMPH NODE DISSECTION Left 06/29/2017   Procedure: LYMPH NODE DISSECTION LEFT LUNG;  Surgeon: Melrose Nakayama, MD;  Location: [place];  Service: Thoracic;  Laterality: Left;  . PACEMAKER  INSERTION  07/2011   [name]  . TONSILLECTOMY  age 76  . TUBAL LIGATION    . VIDEO ASSISTED THORACOSCOPY (VATS)/WEDGE RESECTION Left 06/29/2017   Procedure: VIDEO ASSISTED THORACOSCOPY (VATS)/WEDGE RESECTION;  Surgeon: Melrose Nakayama, MD;  Location: [place];  Service: Thoracic;  Laterality: Left;  Marland Kitchen VIDEO BRONCHOSCOPY WITH ENDOBRONCHIAL NAVIGATION N/A 12/05/2013   Procedure: VIDEO BRONCHOSCOPY WITH ENDOBRONCHIAL NAVIGATION;  Surgeon: Collene Gobble, MD;  [facility];  Service: Thoracic;  Laterality: N/A;    Current Outpatient Medications  Medication Sig Dispense Refill  . amLODipine-benazepril (LOTREL) 10-20 MG per capsule Take 1 capsule by mouth daily.      . cholecalciferol (VITAMIN D) 1000 UNITS tablet Take 2,000 Units by mouth daily.     . Cyanocobalamin (B-12) 3000 MCG CAPS Take 3,000 mcg by mouth daily.    . pravastatin (PRAVACHOL) 20 MG tablet Take 20 mg by mouth at bedtime.     . triamterene-hydrochlorothiazide (MAXZIDE-25) 37.5-25 MG per tablet Take 0.5 tablets by mouth daily.     Marland Kitchen azelastine (ASTELIN) 137 MCG/SPRAY nasal spray Place 1 spray into both nostrils daily as needed for allergies.      No current facility-administered medications for this visit.     Allergies:   Crestor [rosuvastatin calcium] and Sulfonamide derivatives   Social History:  The patient  reports that she has never smoked. She has never used smokeless tobacco. She reports that she does not drink alcohol or use drugs.   Family History:  The patient's   family history includes Breast cancer (age of onset: 13) in her mother; Heart disease in her father.   ROS:  Please see the history of present illness.   All other systems are personally reviewed and negative.    Exam:    Vital Signs:  Ht 5\' 4"  (1.626 m)   Wt 136 lb (61.7 kg)   BMI 23.34 kg/m     Well appearing, alert and conversant, regular work of breathing,  good skin color Eyes- anicteric, neuro- grossly intact, skin- no apparent rash or  lesions or cyanosis, mouth- oral mucosa is pink   Labs/Other Tests and Data Reviewed:    Recent Labs: 08/13/2018: ALT 9; BUN 26; Creatinine 1.42; Hemoglobin 12.3; Platelet Count 261; Potassium 3.3; Sodium 141   Wt Readings from Last 3 Encounters:  04/11/19 136 lb (61.7 kg)  04/04/19 136 lb (61.7 kg)  02/27/19 137 lb (62.1 kg)     Other studies personally reviewed: Additional studies/ records that were reviewed today include As above  Pacemaker Interrogation 3/20 Normal device function SCAF < 2 min confirmed with [facility]grams   ASSESSMENT & PLAN:    Complete heart block  Pacemaker [employer] 2012  Ascending aortic aneurysm 4.8 cm  Hypokalemia  SCAF  Subclinical Atrial fibrillation    Lung cancer stage Ia 7/18 status post partial pneumonectomy  SCAF present, but not of sufficient duration to enjoin anticoagulation at this point  Hypokalemia noted previously,  Repeat lab work scheduled with Dr MM and will have him address  BB may be of some value with As Ao Aneurysm as an antihypertensive-- prob better than maxzide   She is due for repeat imaging of her thoracic aneurysm   Also important that she not take quinolones    COVID 19 screen The patient denies symptoms of COVID 19 at this time.  The importance of social distancing was discussed today.  Follow-up:  38m  Next remote: 41m  Current medicines are reviewed at length with the patient today.   The patient has concerns regarding her medicines.  The following changes were made today:  None but I will reach out to Dr MM  Labs/ tests ordered today include:   No orders of the defined types were placed in this encounter.   Future tests ( post COVID )  Needs echo to assess for pacemaker cardiomyopathy (3%/YR)   Patient Risk:  after full review of this patients clinical status, I feel that they are at moderate risk at this time.  Today, I have spent 65 minutes with the patient with telehealth technology discussing As  above .    Signed, Virl Axe, MD  04/11/2019 3:47 PM     [place] [phone] (office) [phone] (fax)

## 2019-06-07 ENCOUNTER — Ambulatory Visit (INDEPENDENT_AMBULATORY_CARE_PROVIDER_SITE_OTHER): Payer: Medicare Other | Admitting: *Deleted

## 2019-06-07 DIAGNOSIS — I495 Sick sinus syndrome: Secondary | ICD-10-CM

## 2019-06-07 DIAGNOSIS — I442 Atrioventricular block, complete: Secondary | ICD-10-CM

## 2019-06-08 LAB — CUP PACEART REMOTE DEVICE CHECK
Battery Impedance: 2412 Ohm
Battery Remaining Longevity: 24 mo
Battery Voltage: 2.73 V
Brady Statistic AP VP Percent: 35 %
Brady Statistic AP VS Percent: 0 %
Brady Statistic AS VP Percent: 65 %
Brady Statistic AS VS Percent: 0 %
Date Time Interrogation Session: 20200609154930
Implantable Lead Implant Date: 20120816
Implantable Lead Implant Date: 20120816
Implantable Lead Location: 753859
Implantable Lead Location: 753860
Implantable Lead Model: 5076
Implantable Lead Model: 5076
Implantable Pulse Generator Implant Date: 20120816
Lead Channel Impedance Value: 507 Ohm
Lead Channel Impedance Value: 639 Ohm
Lead Channel Pacing Threshold Amplitude: 0.625 V
Lead Channel Pacing Threshold Amplitude: 0.875 V
Lead Channel Pacing Threshold Pulse Width: 0.4 ms
Lead Channel Pacing Threshold Pulse Width: 0.4 ms
Lead Channel Setting Pacing Amplitude: 2 V
Lead Channel Setting Pacing Amplitude: 2.5 V
Lead Channel Setting Pacing Pulse Width: 0.4 ms
Lead Channel Setting Sensing Sensitivity: 2 mV

## 2019-06-10 ENCOUNTER — Other Ambulatory Visit: Payer: Self-pay | Admitting: Internal Medicine

## 2019-06-10 DIAGNOSIS — Z1231 Encounter for screening mammogram for malignant neoplasm of breast: Secondary | ICD-10-CM

## 2019-06-16 NOTE — Progress Notes (Signed)
Remote pacemaker transmission.   

## 2019-06-28 ENCOUNTER — Ambulatory Visit
Admission: RE | Admit: 2019-06-28 | Discharge: 2019-06-28 | Disposition: A | Discharge status: Home / Self Care | Payer: Medicare Other | Source: Ambulatory Visit | Attending: Internal Medicine | Admitting: Internal Medicine

## 2019-06-28 ENCOUNTER — Other Ambulatory Visit: Payer: Self-pay

## 2019-06-28 DIAGNOSIS — Z1231 Encounter for screening mammogram for malignant neoplasm of breast: Secondary | ICD-10-CM | POA: Diagnosis present

## 2019-09-07 ENCOUNTER — Ambulatory Visit (INDEPENDENT_AMBULATORY_CARE_PROVIDER_SITE_OTHER): Payer: Medicare Other | Admitting: *Deleted

## 2019-09-07 DIAGNOSIS — I442 Atrioventricular block, complete: Secondary | ICD-10-CM | POA: Diagnosis not present

## 2019-09-07 LAB — CUP PACEART REMOTE DEVICE CHECK
Battery Impedance: 2505 Ohm
Battery Remaining Longevity: 22 mo
Battery Voltage: 2.73 V
Brady Statistic AP VP Percent: 34 %
Brady Statistic AP VS Percent: 0 %
Brady Statistic AS VP Percent: 66 %
Brady Statistic AS VS Percent: 0 %
Date Time Interrogation Session: 20200909104553
Implantable Lead Implant Date: 20120816
Implantable Lead Implant Date: 20120816
Implantable Lead Location: 753859
Implantable Lead Location: 753860
Implantable Lead Model: 5076
Implantable Lead Model: 5076
Implantable Pulse Generator Implant Date: 20120816
Lead Channel Impedance Value: 484 Ohm
Lead Channel Impedance Value: 629 Ohm
Lead Channel Pacing Threshold Amplitude: 0.5 V
Lead Channel Pacing Threshold Amplitude: 0.625 V
Lead Channel Pacing Threshold Pulse Width: 0.4 ms
Lead Channel Pacing Threshold Pulse Width: 0.4 ms
Lead Channel Setting Pacing Amplitude: 2 V
Lead Channel Setting Pacing Amplitude: 2.5 V
Lead Channel Setting Pacing Pulse Width: 0.4 ms
Lead Channel Setting Sensing Sensitivity: 2 mV

## 2019-09-22 NOTE — Progress Notes (Signed)
Remote pacemaker transmission.   

## 2019-10-16 NOTE — Progress Notes (Signed)
Date:  10/18/2019   ID:  Katrina Hunter, DOB [birth year], MRN [id number]  Patient Location:  [address] [place] [address]   Provider location:   [facility], [place] office  PCP:  [name], MD  Cardiologist:  Arvid Right [facility]  Chief Complaint  Patient presents with  . Other    6 month follow up. Patient denies chest pain and SOB at this time. Meds reviewed verbally with patient.     History of Present Illness:    Katrina Hunter is a 78 y.o. 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 Caryl Comes  Aortic athero moderate on CT performed in 2019,  Notably in the arch, descending aorta 4.8 cm dilated  myalagia on crestor Changed to pravastatin, currently no side effects  1/2 maxzide pill daily Blood pressure was running low and she was getting dehydrated on a full pill  Lab work reviewed with her in detail Total chol 161, LDL 88 Prior total chol 269 in 02/2015 Potassium 3.4 HGB 11.8 TSH 7.6  EKG personally reviewed by myself on todays visit Rate 78 bpm, paced , LAD/LAFB  Prior CV studies:   The following studies were reviewed today:  CT chest  07/2018 Stable 4.8 cm ascending thoracic aortic aneurysm. Ascending thoracic aortic aneurysm.  CT scan 2012: aorta dilation was 4.4 cm CT scan 20113: Aorta dilation 4.8 cm CT scan: 2014: 4.7 cm CT scan 11/2015: 5.1 x 4.7 cm CT scan 12/2016: 5.1 cm CT scan 01/2018 aorta dilation 4.7   Past Medical History:  Diagnosis Date  . Aneurysm ([place])    aortic   . AV block, complete ([place])    PPM placed  07/2011 [facility]  . Headache    migraines  . Heart murmur   . History of prolapse of bladder   . HLD (hyperlipidemia)   . HTN (hypertension)   . Hypothyroidism     no rx  . Lung cancer ([place]) dx'd 06/2017  . Pacemaker 12  . Pulmonary nodule, left 04/22/2017   From order notes   Past Surgical History:  Procedure Laterality Date  . BLADDER SURGERY  01/03/2017  . LYMPH NODE DISSECTION Left 06/29/2017   Procedure: LYMPH NODE DISSECTION LEFT LUNG;  Surgeon: Melrose Nakayama, MD;  Location: [place];  Service: Thoracic;  Laterality: Left;  . PACEMAKER INSERTION  07/2011   [name]  . TONSILLECTOMY  age 77  . TUBAL LIGATION    . VIDEO ASSISTED THORACOSCOPY (VATS)/WEDGE RESECTION Left 06/29/2017   Procedure: VIDEO ASSISTED THORACOSCOPY (VATS)/WEDGE RESECTION;  Surgeon: Melrose Nakayama, MD;  Location: [place];  Service: Thoracic;  Laterality: Left;  Marland Kitchen VIDEO BRONCHOSCOPY WITH ENDOBRONCHIAL NAVIGATION N/A 12/05/2013   Procedure: VIDEO BRONCHOSCOPY WITH ENDOBRONCHIAL NAVIGATION;  Surgeon: Collene Gobble, MD;  Location: [place];  Service: Thoracic;  Laterality: N/A;     Current Meds  Medication Sig  . amLODipine-benazepril (LOTREL) 10-20 MG per capsule Take 1 capsule by mouth daily.    . cholecalciferol (VITAMIN D) 1000 UNITS tablet Take 2,000 Units by mouth daily.   . Cyanocobalamin (B-12) 3000 MCG CAPS  Take 3,000 mcg by mouth daily.  Marland Kitchen ezetimibe (ZETIA) 10 MG tablet Take 10 mg by mouth daily.  . potassium chloride (KLOR-CON) 8 MEQ tablet Take by mouth.  . pravastatin (PRAVACHOL) 20 MG tablet Take 20 mg by mouth at bedtime.   . triamcinolone (NASACORT) 55 MCG/ACT AERO nasal inhaler Place into the nose.  . triamterene-hydrochlorothiazide (MAXZIDE-25) 37.5-25 MG per tablet Take 0.5 tablets by mouth daily.      Allergies:   Crestor [rosuvastatin calcium] and Sulfonamide derivatives   Social History   Tobacco Use  . Smoking status: Never Smoker  . Smokeless tobacco: Never Used  Substance Use  Topics  . Alcohol use: No  . Drug use: No     Current Outpatient Medications on File Prior to Visit  Medication Sig Dispense Refill  . amLODipine-benazepril (LOTREL) 10-20 MG per capsule Take 1 capsule by mouth daily.      . cholecalciferol (VITAMIN D) 1000 UNITS tablet Take 2,000 Units by mouth daily.     . Cyanocobalamin (B-12) 3000 MCG CAPS Take 3,000 mcg by mouth daily.    Marland Kitchen ezetimibe (ZETIA) 10 MG tablet Take 10 mg by mouth daily.    . potassium chloride (KLOR-CON) 8 MEQ tablet Take by mouth.    . pravastatin (PRAVACHOL) 20 MG tablet Take 20 mg by mouth at bedtime.     . triamcinolone (NASACORT) 55 MCG/ACT AERO nasal inhaler Place into the nose.    . triamterene-hydrochlorothiazide (MAXZIDE-25) 37.5-25 MG per tablet Take 0.5 tablets by mouth daily.      No current facility-administered medications on file prior to visit.      Family Hx: The patient's family history includes Breast cancer (age of onset: 52) in her mother; Heart disease in her father.  ROS:   Please see the history of present illness.    Review of Systems  Constitutional: Negative.   Respiratory: Negative.   Cardiovascular: Negative.   Gastrointestinal: Negative.   Musculoskeletal: Negative.   Neurological: Negative.   Psychiatric/Behavioral: Negative.   All other systems reviewed and are negative.    Labs/Other Tests and Data Reviewed:    Recent Labs: No results found for requested labs within last 8760 hours.   Recent Lipid Panel No results found for: CHOL, TRIG, HDL, CHOLHDL, LDLCALC, LDLDIRECT  Wt Readings from Last 3 Encounters:  10/18/19 134 lb (60.8 kg)  04/11/19 136 lb (61.7 kg)  04/04/19 136 lb (61.7 kg)     Exam:    BP 136/70 (BP Location: Left Arm, Patient Position: Sitting, Cuff Size: Normal)   Pulse 81   Ht 5\' 4"  (1.626 m)   Wt 134 lb (60.8 kg)   BMI 23.00 kg/m  Constitutional:  oriented to person, place, and time. No distress.  HENT:  Head: Grossly normal Eyes:  no  discharge. No scleral icterus.  Neck: No JVD, no carotid bruits  Cardiovascular: Regular rate and rhythm, no murmurs appreciated Pulmonary/Chest: Clear to auscultation bilaterally, no wheezes or rails Abdominal: Soft.  no distension.  no tenderness.  Musculoskeletal: Normal range of motion Neurological:  normal muscle tone. Coordination normal. No atrophy Skin: Skin warm and dry Psychiatric: normal affect, pleasant   ASSESSMENT & PLAN:     Thoracic ascending aortic aneurysm (HCC) Relatively stable size 4.8 cm over the past 5 years  Has not had her cancer survaillance scan in 2020 covid delay this year  AV block, complete ([place]) - Plan: Ambulatory referral to Cardiac followed by Dr. Caryl Comes  Mixed hyperlipidemia Numbers  improved  Pacemaker Will help transition to EP Consult placed  Adenocarcinoma of left lung, stage 1 ([place]) Reports she was non-smoker Had wedge resection Needs CT scan. Will discuss with oncology  Complete heart block [facility]) - Previously seen by Dr. Lovena Le 2012 for pacer placement Now followed by Dr. Caryl Comes Battery on device 7 to 37 months, Avg 22 months  Aortic atherosclerosis She has moderate diffuse aortic atherosclerosis on CT scan On statin and zetia    Total encounter time more than 25 minutes  Greater than 50% was spent in counseling and coordination of care with the patient   Disposition: Follow-up in 6 months   Signed, Ida Rogue, MD  10/18/2019 2:07 PM    [place] Office [address], [place], [place] [address]

## 2019-10-18 ENCOUNTER — Other Ambulatory Visit: Payer: Self-pay

## 2019-10-18 ENCOUNTER — Ambulatory Visit (INDEPENDENT_AMBULATORY_CARE_PROVIDER_SITE_OTHER): Payer: Medicare Other | Admitting: Cardiovascular Disease

## 2019-10-18 ENCOUNTER — Encounter: Payer: Self-pay | Admitting: Cardiovascular Disease

## 2019-10-18 VITALS — BP 136/70 | HR 81 | Ht 64.0 in | Wt 134.0 lb

## 2019-10-18 DIAGNOSIS — I495 Sick sinus syndrome: Secondary | ICD-10-CM

## 2019-10-18 DIAGNOSIS — E782 Mixed hyperlipidemia: Secondary | ICD-10-CM

## 2019-10-18 DIAGNOSIS — I700 Atherosclerosis of aorta: Secondary | ICD-10-CM

## 2019-10-18 DIAGNOSIS — I442 Atrioventricular block, complete: Secondary | ICD-10-CM | POA: Diagnosis not present

## 2019-10-18 DIAGNOSIS — I10 Essential (primary) hypertension: Secondary | ICD-10-CM

## 2019-10-18 DIAGNOSIS — C3492 Malignant neoplasm of unspecified part of left bronchus or lung: Secondary | ICD-10-CM

## 2019-10-18 DIAGNOSIS — Z950 Presence of cardiac pacemaker: Secondary | ICD-10-CM | POA: Diagnosis not present

## 2019-10-18 NOTE — Patient Instructions (Signed)

## 2019-10-26 NOTE — Progress Notes (Signed)
Can we please order a chest CTA (with and w/o contrast) to evaluate dilated ascending aorta, also surveillance for lung CA

## 2019-10-27 ENCOUNTER — Telehealth: Payer: Self-pay | Admitting: *Deleted

## 2019-10-27 DIAGNOSIS — I10 Essential (primary) hypertension: Secondary | ICD-10-CM

## 2019-10-27 DIAGNOSIS — I7121 Aneurysm of the ascending aorta, without rupture: Secondary | ICD-10-CM

## 2019-10-27 DIAGNOSIS — I712 Thoracic aortic aneurysm, without rupture: Secondary | ICD-10-CM

## 2019-10-27 DIAGNOSIS — Z01812 Encounter for preprocedural laboratory examination: Secondary | ICD-10-CM

## 2019-10-27 DIAGNOSIS — C3492 Malignant neoplasm of unspecified part of left bronchus or lung: Secondary | ICD-10-CM

## 2019-10-27 NOTE — Telephone Encounter (Signed)
-----   Message from Minna Merritts, MD sent at 10/26/2019  8:43 PM EDT -----   ----- Message ----- From: Curt Bears, MD Sent: 10/18/2019   4:49 PM EDT To: Minna Merritts, MD  Yes please do.  She did not have scan since August 2019.  Thank you.Mohamed ----- Message ----- From: Minna Merritts, MD Sent: 10/18/2019   2:32 PM EDT To: Curt Bears, MD  Hi there, saw Janyth Pupa today. Do you want me to order a chest CT? Surveillance I am following the dilated aorta Bobbye Riggs, MD cardiology

## 2019-10-27 NOTE — Telephone Encounter (Signed)
Author: Minna Merritts, MD Service: Cardiology Author Type: Physician  Filed: 10/26/2019 8:43 PM Encounter Date: 10/18/2019 Status: Signed  Editor: Minna Merritts, MD (Physician)     Show:Clear all [x] Manual[] Template[] Copied  Added by: [x] Minna Merritts, MD  [] Hover for details Can we please order a chest CTA (with and w/o contrast) to evaluate dilated ascending aorta, also surveillance for lung CA

## 2019-10-28 NOTE — Telephone Encounter (Signed)
I spoke with the patient regarding the need to have a CTA of the chest w/ & w/o contrast to follow up on her lung cancer and dilated aorta.  The patient was agreeable with having this done.  Appt scheduled for: - Friday 11/04/19 at 8:00 am (arrive at 7:45 am) at [facility] - check in at the radiology desk - Liquids only for 4 hours prior  She is aware she will need to have a BMP done prior. I have advised the patient I will place the lab order for the [place] and she can have this done anytime between now and 11/03/19.  The patient voices understanding of the above and is agreeable.   Message sent to pre-cert as well.

## 2019-10-31 ENCOUNTER — Other Ambulatory Visit
Admission: RE | Admit: 2019-10-31 | Discharge: 2019-10-31 | Disposition: A | Discharge status: Home / Self Care | Payer: Medicare Other | Source: Ambulatory Visit | Attending: Cardiovascular Disease | Admitting: Cardiovascular Disease

## 2019-10-31 DIAGNOSIS — I10 Essential (primary) hypertension: Secondary | ICD-10-CM | POA: Diagnosis present

## 2019-10-31 DIAGNOSIS — Z01812 Encounter for preprocedural laboratory examination: Secondary | ICD-10-CM | POA: Diagnosis present

## 2019-10-31 LAB — BASIC METABOLIC PANEL
Anion gap: 14 (ref 5–15)
BUN: 18 mg/dL (ref 8–23)
CO2: 24 mmol/L (ref 22–32)
Calcium: 9.6 mg/dL (ref 8.9–10.3)
Chloride: 103 mmol/L (ref 98–111)
Creatinine, Ser: 0.99 mg/dL (ref 0.44–1.00)
GFR calc Af Amer: >60 mL/min (ref >60)
GFR calc non Af Amer: 55 mL/min — ABNORMAL LOW (ref >60)
Glucose, Bld: 91 mg/dL (ref 70–99)
Potassium: 3.2 mmol/L — ABNORMAL LOW (ref 3.5–5.1)
Sodium: 141 mmol/L (ref 135–145)

## 2019-11-04 ENCOUNTER — Ambulatory Visit: Payer: Medicare Other

## 2019-11-08 ENCOUNTER — Ambulatory Visit
Admission: RE | Admit: 2019-11-08 | Discharge: 2019-11-08 | Disposition: A | Discharge status: Home / Self Care | Payer: Medicare Other | Source: Ambulatory Visit | Attending: Cardiovascular Disease | Admitting: Cardiovascular Disease

## 2019-11-08 ENCOUNTER — Other Ambulatory Visit: Payer: Self-pay

## 2019-11-08 DIAGNOSIS — C3492 Malignant neoplasm of unspecified part of left bronchus or lung: Secondary | ICD-10-CM | POA: Insufficient documentation

## 2019-11-08 DIAGNOSIS — I712 Thoracic aortic aneurysm, without rupture: Secondary | ICD-10-CM | POA: Insufficient documentation

## 2019-11-08 DIAGNOSIS — I7121 Aneurysm of the ascending aorta, without rupture: Secondary | ICD-10-CM

## 2019-11-08 MED ORDER — IOHEXOL 350 MG/ML SOLN
75.0000 mL | Freq: Once | INTRAVENOUS | Status: AC | PRN
  Administered 2019-11-08: 75 mL via INTRAVENOUS

## 2019-12-07 ENCOUNTER — Ambulatory Visit (INDEPENDENT_AMBULATORY_CARE_PROVIDER_SITE_OTHER): Payer: Medicare Other | Admitting: *Deleted

## 2019-12-07 DIAGNOSIS — I442 Atrioventricular block, complete: Secondary | ICD-10-CM | POA: Diagnosis not present

## 2019-12-07 LAB — CUP PACEART REMOTE DEVICE CHECK
Battery Impedance: 3111 Ohm
Battery Remaining Longevity: 18 mo
Battery Voltage: 2.72 V
Brady Statistic AP VP Percent: 35 %
Brady Statistic AP VS Percent: 0 %
Brady Statistic AS VP Percent: 65 %
Brady Statistic AS VS Percent: 0 %
Date Time Interrogation Session: 20201209095729
Implantable Lead Implant Date: 20120816
Implantable Lead Implant Date: 20120816
Implantable Lead Location: 753859
Implantable Lead Location: 753860
Implantable Lead Model: 5076
Implantable Lead Model: 5076
Implantable Pulse Generator Implant Date: 20120816
Lead Channel Impedance Value: 510 Ohm
Lead Channel Impedance Value: 641 Ohm
Lead Channel Pacing Threshold Amplitude: 0.5 V
Lead Channel Pacing Threshold Amplitude: 0.75 V
Lead Channel Pacing Threshold Pulse Width: 0.4 ms
Lead Channel Pacing Threshold Pulse Width: 0.4 ms
Lead Channel Setting Pacing Amplitude: 2 V
Lead Channel Setting Pacing Amplitude: 2.5 V
Lead Channel Setting Pacing Pulse Width: 0.4 ms
Lead Channel Setting Sensing Sensitivity: 2 mV

## 2020-01-14 NOTE — Progress Notes (Signed)
PPM remote 

## 2020-03-07 ENCOUNTER — Ambulatory Visit (INDEPENDENT_AMBULATORY_CARE_PROVIDER_SITE_OTHER): Payer: Medicare Other | Admitting: *Deleted

## 2020-03-07 DIAGNOSIS — I442 Atrioventricular block, complete: Secondary | ICD-10-CM | POA: Diagnosis not present

## 2020-03-07 LAB — CUP PACEART REMOTE DEVICE CHECK
Battery Impedance: 3220 Ohm
Battery Remaining Longevity: 17 mo
Battery Voltage: 2.71 V
Brady Statistic AP VP Percent: 36 %
Brady Statistic AP VS Percent: 0 %
Brady Statistic AS VP Percent: 64 %
Brady Statistic AS VS Percent: 0 %
Date Time Interrogation Session: 20210310082147
Implantable Lead Implant Date: 20120816
Implantable Lead Implant Date: 20120816
Implantable Lead Location: 753859
Implantable Lead Location: 753860
Implantable Lead Model: 5076
Implantable Lead Model: 5076
Implantable Pulse Generator Implant Date: 20120816
Lead Channel Impedance Value: 475 Ohm
Lead Channel Impedance Value: 637 Ohm
Lead Channel Pacing Threshold Amplitude: 0.5 V
Lead Channel Pacing Threshold Amplitude: 0.75 V
Lead Channel Pacing Threshold Pulse Width: 0.4 ms
Lead Channel Pacing Threshold Pulse Width: 0.4 ms
Lead Channel Setting Pacing Amplitude: 2 V
Lead Channel Setting Pacing Amplitude: 2.5 V
Lead Channel Setting Pacing Pulse Width: 0.4 ms
Lead Channel Setting Sensing Sensitivity: 2 mV

## 2020-03-07 NOTE — Progress Notes (Signed)
PPM Remote  

## 2020-05-31 ENCOUNTER — Other Ambulatory Visit: Payer: Self-pay | Admitting: Internal Medicine

## 2020-05-31 DIAGNOSIS — Z1231 Encounter for screening mammogram for malignant neoplasm of breast: Secondary | ICD-10-CM

## 2020-06-06 ENCOUNTER — Ambulatory Visit (INDEPENDENT_AMBULATORY_CARE_PROVIDER_SITE_OTHER): Payer: Medicare Other | Admitting: *Deleted

## 2020-06-06 DIAGNOSIS — I442 Atrioventricular block, complete: Secondary | ICD-10-CM | POA: Diagnosis not present

## 2020-06-07 ENCOUNTER — Telehealth: Payer: Self-pay

## 2020-06-07 NOTE — Telephone Encounter (Signed)
Left message for patient to remind of missed remote transmission.  

## 2020-06-08 LAB — CUP PACEART REMOTE DEVICE CHECK
Battery Impedance: 3350 Ohm
Battery Remaining Longevity: 16 mo
Battery Voltage: 2.71 V
Brady Statistic AP VP Percent: 36 %
Brady Statistic AP VS Percent: 0 %
Brady Statistic AS VP Percent: 64 %
Brady Statistic AS VS Percent: 0 %
Date Time Interrogation Session: 20210610151035
Implantable Lead Implant Date: 20120816
Implantable Lead Implant Date: 20120816
Implantable Lead Location: 753859
Implantable Lead Location: 753860
Implantable Lead Model: 5076
Implantable Lead Model: 5076
Implantable Pulse Generator Implant Date: 20120816
Lead Channel Impedance Value: 471 Ohm
Lead Channel Impedance Value: 650 Ohm
Lead Channel Pacing Threshold Amplitude: 0.625 V
Lead Channel Pacing Threshold Amplitude: 0.75 V
Lead Channel Pacing Threshold Pulse Width: 0.4 ms
Lead Channel Pacing Threshold Pulse Width: 0.4 ms
Lead Channel Setting Pacing Amplitude: 2 V
Lead Channel Setting Pacing Amplitude: 2.5 V
Lead Channel Setting Pacing Pulse Width: 0.4 ms
Lead Channel Setting Sensing Sensitivity: 2 mV

## 2020-06-11 NOTE — Progress Notes (Signed)
Remote pacemaker transmission.   

## 2020-06-29 ENCOUNTER — Ambulatory Visit
Admission: RE | Admit: 2020-06-29 | Discharge: 2020-06-29 | Disposition: A | Discharge status: Home / Self Care | Payer: Medicare Other | Source: Ambulatory Visit | Attending: Internal Medicine | Admitting: Internal Medicine

## 2020-06-29 DIAGNOSIS — Z1231 Encounter for screening mammogram for malignant neoplasm of breast: Secondary | ICD-10-CM | POA: Diagnosis not present

## 2020-09-05 ENCOUNTER — Ambulatory Visit (INDEPENDENT_AMBULATORY_CARE_PROVIDER_SITE_OTHER): Payer: Medicare Other | Admitting: *Deleted

## 2020-09-05 DIAGNOSIS — I495 Sick sinus syndrome: Secondary | ICD-10-CM

## 2020-09-05 LAB — CUP PACEART REMOTE DEVICE CHECK
Battery Impedance: 3756 Ohm
Battery Remaining Longevity: 13 mo
Battery Voltage: 2.71 V
Brady Statistic AP VP Percent: 37 %
Brady Statistic AP VS Percent: 0 %
Brady Statistic AS VP Percent: 63 %
Brady Statistic AS VS Percent: 0 %
Date Time Interrogation Session: 20210908120606
Implantable Lead Implant Date: 20120816
Implantable Lead Implant Date: 20120816
Implantable Lead Location: 753859
Implantable Lead Location: 753860
Implantable Lead Model: 5076
Implantable Lead Model: 5076
Implantable Pulse Generator Implant Date: 20120816
Lead Channel Impedance Value: 501 Ohm
Lead Channel Impedance Value: 680 Ohm
Lead Channel Pacing Threshold Amplitude: 0.5 V
Lead Channel Pacing Threshold Amplitude: 0.75 V
Lead Channel Pacing Threshold Pulse Width: 0.4 ms
Lead Channel Pacing Threshold Pulse Width: 0.4 ms
Lead Channel Setting Pacing Amplitude: 2 V
Lead Channel Setting Pacing Amplitude: 2.5 V
Lead Channel Setting Pacing Pulse Width: 0.4 ms
Lead Channel Setting Sensing Sensitivity: 2 mV

## 2020-09-06 NOTE — Progress Notes (Signed)
Remote pacemaker transmission.   

## 2020-10-19 NOTE — Progress Notes (Signed)
Date:  10/22/2020   ID:  Katrina Hunter, DOB Feb 06, 1941, MRN [id number]  Patient Location:  [address] [place] [address]   Provider location:   [facility], [place] office  PCP:  [name], MD  Cardiologist:  Arvid Right [facility]  Chief Complaint  Patient presents with  . Other    12 month follow up. Meds reviewed verbally with patient.     History of Present Illness:    Katrina Hunter is a 79 y.o. 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  Not much walking Walks the dog  Dr. Lovena Le following pacer No arrhythmia 13 mo left on battery  No SOB, no chest pain  Aortic athero moderate on CT performed in 2019,  Notably in the arch, descending aorta 4.8 cm dilated  myalagia on crestor Changed to pravastatin, currently no side effects And zetia  Lab work reviewed Total chol 161, LDL 84 Prior total chol 269 in 02/2015 Potassium 3.6 HGB 12.6 TSH 7.6 in 2020, was 8.4 in 03/2020   Prior CV studies:   The following studies were reviewed today:  CT chest  07/2018 Stable 4.8 cm ascending thoracic aortic aneurysm. Ascending thoracic aortic aneurysm.  CT scan 2012: aorta dilation was 4.4 cm CT scan 2013: Aorta dilation 4.8 cm CT scan: 2014: 4.7 cm CT scan 11/2015: 5.1 x 4.7 cm CT scan 12/2016: 5.1 cm CT scan 01/2018 aorta dilation 4.7   Past Medical History:  Diagnosis Date  . Aneurysm ([place])    aortic   . AV block, complete ([place])    PPM placed 07/2011 [facility]  . Headache    migraines  . Heart murmur   .  History of prolapse of bladder   . HLD (hyperlipidemia)   . HTN (hypertension)   . Hypothyroidism     no rx  . Lung cancer ([place]) dx'd 06/2017  . Pacemaker 12  . Pulmonary nodule, left 04/22/2017   From order notes   Past Surgical History:  Procedure Laterality Date  . BLADDER SURGERY  01/03/2017  . LYMPH NODE DISSECTION Left 06/29/2017   Procedure: LYMPH NODE DISSECTION LEFT LUNG;  Surgeon: Melrose Nakayama, MD;  Location: [place];  Service: Thoracic;  Laterality: Left;  . PACEMAKER INSERTION  07/2011   [name]  . TONSILLECTOMY  age 57  . TUBAL LIGATION    . VIDEO ASSISTED THORACOSCOPY (VATS)/WEDGE RESECTION Left 06/29/2017   Procedure: VIDEO ASSISTED THORACOSCOPY (VATS)/WEDGE RESECTION;  Surgeon: Melrose Nakayama, MD;  Location: [place];  Service: Thoracic;  Laterality: Left;  Marland Kitchen VIDEO BRONCHOSCOPY WITH ENDOBRONCHIAL NAVIGATION N/A 12/05/2013   Procedure: VIDEO BRONCHOSCOPY WITH ENDOBRONCHIAL NAVIGATION;  Surgeon: Collene Gobble, MD;  Location: [place];  Service: Thoracic;  Laterality: N/A;     Current Meds  Medication Sig  . amLODipine-benazepril (LOTREL) 10-20 MG per capsule Take 1 capsule by mouth daily.    . cholecalciferol (VITAMIN D) 1000 UNITS tablet Take 2,000 Units by mouth daily.   . Cyanocobalamin (B-12) 3000 MCG CAPS Take 3,000 mcg by mouth daily.  Marland Kitchen ezetimibe (ZETIA) 10 MG  tablet Take 10 mg by mouth daily.  . potassium chloride (KLOR-CON) 8 MEQ tablet Take 8 mEq by mouth 2 (two) times daily.  . pravastatin (PRAVACHOL) 20 MG tablet Take 20 mg by mouth at bedtime.   . triamcinolone (NASACORT) 55 MCG/ACT AERO nasal inhaler Place into the nose.  . triamterene-hydrochlorothiazide (MAXZIDE-25) 37.5-25 MG per tablet Take 0.5 tablets by mouth daily.      Allergies:   Crestor [rosuvastatin calcium] and Sulfonamide derivatives   Social History   Tobacco Use  . Smoking status: Never Smoker  . Smokeless tobacco: Never Used  Vaping Use  . Vaping Use: Never used  Substance  Use Topics  . Alcohol use: No  . Drug use: No     Family Hx: The patient's family history includes Breast cancer (age of onset: 44) in her mother; Heart disease in her father.  ROS:   Please see the history of present illness.    Review of Systems  Constitutional: Negative.   Respiratory: Negative.   Cardiovascular: Negative.   Gastrointestinal: Negative.   Musculoskeletal: Negative.   Neurological: Negative.   Psychiatric/Behavioral: Negative.   All other systems reviewed and are negative.    Labs/Other Tests and Data Reviewed:    Recent Labs: 10/31/2019: BUN 18; Creatinine, Ser 0.99; Potassium 3.2; Sodium 141   Recent Lipid Panel No results found for: CHOL, TRIG, HDL, CHOLHDL, LDLCALC, LDLDIRECT  Wt Readings from Last 3 Encounters:  10/22/20 133 lb 4 oz (60.4 kg)  10/18/19 134 lb (60.8 kg)  04/11/19 136 lb (61.7 kg)     Exam:    BP 112/60 (BP Location: Left Arm, Patient Position: Sitting, Cuff Size: Normal)   Pulse 70   Ht 5' 3.5" (1.613 m)   Wt 133 lb 4 oz (60.4 kg)   SpO2 97%   BMI 23.23 kg/m  Constitutional:  oriented to person, place, and time. No distress.  HENT:  Head: Grossly normal Eyes:  no discharge. No scleral icterus.  Neck: No JVD, no carotid bruits  Cardiovascular: Regular rate and rhythm, no murmurs appreciated Pulmonary/Chest: Clear to auscultation bilaterally, no wheezes or rails Abdominal: Soft.  no distension.  no tenderness.  Musculoskeletal: Normal range of motion Neurological:  normal muscle tone. Coordination normal. No atrophy Skin: Skin warm and dry Psychiatric: normal affect, pleasant   ASSESSMENT & PLAN:    Thoracic ascending aortic aneurysm (HCC) Relatively stable size 4.8 cm over the past 10 years  scan in 2020 stable  AV block, complete (HCC) -  Cardiac pacer followed by Dr. Lovena Le  Mixed hyperlipidemia Cholesterol is at goal on the current lipid regimen. No changes to the medications were made.  Pacemaker Followed  by EP Battery 13 months  Adenocarcinoma of left lung, stage 1 ([place]) Reports she was non-smoker Had wedge resection Followed with CT, this will also help [place] to evaluate her aorta which has been stable  Complete heart block (HCC) - Followed by Dr. Lovena Le 13 months remaining on battery Denies any new symptoms  Aortic atherosclerosis She has moderate diffuse aortic atherosclerosis on CT scan On statin and zetia Goal LDL <70   Total encounter time more than 25 minutes  Greater than 50% was spent in counseling and coordination of care with the patient   Signed, Ida Rogue, MD  10/22/2020 10:53 AM    [place] Office [address], [place], [place] [address]

## 2020-10-22 ENCOUNTER — Other Ambulatory Visit: Payer: Self-pay

## 2020-10-22 ENCOUNTER — Ambulatory Visit: Payer: Medicare Other | Admitting: Cardiovascular Disease

## 2020-10-22 ENCOUNTER — Encounter: Payer: Self-pay | Admitting: Cardiovascular Disease

## 2020-10-22 VITALS — BP 112/60 | HR 70 | Ht 63.5 in | Wt 133.2 lb

## 2020-10-22 DIAGNOSIS — I495 Sick sinus syndrome: Secondary | ICD-10-CM

## 2020-10-22 DIAGNOSIS — C3492 Malignant neoplasm of unspecified part of left bronchus or lung: Secondary | ICD-10-CM | POA: Diagnosis not present

## 2020-10-22 DIAGNOSIS — I7121 Aneurysm of the ascending aorta, without rupture: Secondary | ICD-10-CM

## 2020-10-22 DIAGNOSIS — I712 Thoracic aortic aneurysm, without rupture: Secondary | ICD-10-CM

## 2020-10-22 DIAGNOSIS — I442 Atrioventricular block, complete: Secondary | ICD-10-CM | POA: Diagnosis not present

## 2020-10-22 DIAGNOSIS — I10 Essential (primary) hypertension: Secondary | ICD-10-CM

## 2020-10-22 NOTE — Patient Instructions (Signed)
Medication Instructions:  No changes  If you need a refill on your cardiac medications before your next appointment, please call your pharmacy.    Lab work: No new labs needed   If you have labs (blood work) drawn today and your tests are completely normal, you will receive your results only by: . MyChart Message (if you have MyChart) OR . A paper copy in the mail If you have any lab test that is abnormal or we need to change your treatment, we will call you to review the results.   Testing/Procedures: No new testing needed   Follow-Up: At [facility], you and your health needs are our priority.  As part of our continuing mission to provide you with exceptional heart care, we have created designated Provider Care Teams.  These Care Teams include your primary Cardiologist (physician) and Advanced Practice Providers (APPs -  Physician Assistants and Nurse Practitioners) who all work together to provide you with the care you need, when you need it.  . You will need a follow up appointment in 12 months  . Providers on your designated Care Team:   . Christopher Berge, NP . Ryan Dunn, PA-C . Jacquelyn Visser, PA-C  Any Other Special Instructions Will Be Listed Below (If Applicable).  COVID-19 Vaccine Information can be found at: [email or web address] For questions related to vaccine distribution or appointments, please email [email or web address] or call [phone].     

## 2020-12-05 ENCOUNTER — Ambulatory Visit: Payer: Medicare Other

## 2020-12-06 ENCOUNTER — Ambulatory Visit (INDEPENDENT_AMBULATORY_CARE_PROVIDER_SITE_OTHER): Payer: Medicare Other

## 2020-12-06 DIAGNOSIS — I495 Sick sinus syndrome: Secondary | ICD-10-CM | POA: Diagnosis not present

## 2020-12-06 LAB — CUP PACEART REMOTE DEVICE CHECK
Battery Impedance: 4051 Ohm
Battery Remaining Longevity: 12 mo
Battery Voltage: 2.7 V
Brady Statistic AP VP Percent: 37 %
Brady Statistic AP VS Percent: 0 %
Brady Statistic AS VP Percent: 63 %
Brady Statistic AS VS Percent: 0 %
Date Time Interrogation Session: 20211208103722
Implantable Lead Implant Date: 20120816
Implantable Lead Implant Date: 20120816
Implantable Lead Location: 753859
Implantable Lead Location: 753860
Implantable Lead Model: 5076
Implantable Lead Model: 5076
Implantable Pulse Generator Implant Date: 20120816
Lead Channel Impedance Value: 519 Ohm
Lead Channel Impedance Value: 699 Ohm
Lead Channel Pacing Threshold Amplitude: 0.5 V
Lead Channel Pacing Threshold Amplitude: 0.625 V
Lead Channel Pacing Threshold Pulse Width: 0.4 ms
Lead Channel Pacing Threshold Pulse Width: 0.4 ms
Lead Channel Setting Pacing Amplitude: 2 V
Lead Channel Setting Pacing Amplitude: 2.5 V
Lead Channel Setting Pacing Pulse Width: 0.4 ms
Lead Channel Setting Sensing Sensitivity: 2 mV

## 2020-12-07 LAB — CUP PACEART REMOTE DEVICE CHECK
Battery Impedance: 4051 Ohm
Battery Remaining Longevity: 12 mo
Battery Voltage: 2.7 V
Brady Statistic AP VP Percent: 37 %
Brady Statistic AP VS Percent: 0 %
Brady Statistic AS VP Percent: 63 %
Brady Statistic AS VS Percent: 0 %
Date Time Interrogation Session: 20211208103722
Implantable Lead Implant Date: 20120816
Implantable Lead Implant Date: 20120816
Implantable Lead Location: 753859
Implantable Lead Location: 753860
Implantable Lead Model: 5076
Implantable Lead Model: 5076
Implantable Pulse Generator Implant Date: 20120816
Lead Channel Impedance Value: 519 Ohm
Lead Channel Impedance Value: 699 Ohm
Lead Channel Pacing Threshold Amplitude: 0.5 V
Lead Channel Pacing Threshold Amplitude: 0.625 V
Lead Channel Pacing Threshold Pulse Width: 0.4 ms
Lead Channel Pacing Threshold Pulse Width: 0.4 ms
Lead Channel Setting Pacing Amplitude: 2 V
Lead Channel Setting Pacing Amplitude: 2.5 V
Lead Channel Setting Pacing Pulse Width: 0.4 ms
Lead Channel Setting Sensing Sensitivity: 2 mV

## 2020-12-19 NOTE — Progress Notes (Signed)
Remote pacemaker transmission.   

## 2021-01-07 ENCOUNTER — Ambulatory Visit (INDEPENDENT_AMBULATORY_CARE_PROVIDER_SITE_OTHER): Payer: Medicare Other

## 2021-01-07 DIAGNOSIS — I442 Atrioventricular block, complete: Secondary | ICD-10-CM

## 2021-01-07 LAB — CUP PACEART REMOTE DEVICE CHECK
Battery Impedance: 4129 Ohm
Battery Remaining Longevity: 11 mo
Battery Voltage: 2.69 V
Brady Statistic AP VP Percent: 37 %
Brady Statistic AP VS Percent: 0 %
Brady Statistic AS VP Percent: 63 %
Brady Statistic AS VS Percent: 0 %
Date Time Interrogation Session: 20220110095839
Implantable Lead Implant Date: 20120816
Implantable Lead Implant Date: 20120816
Implantable Lead Location: 753859
Implantable Lead Location: 753860
Implantable Lead Model: 5076
Implantable Lead Model: 5076
Implantable Pulse Generator Implant Date: 20120816
Lead Channel Impedance Value: 501 Ohm
Lead Channel Impedance Value: 684 Ohm
Lead Channel Pacing Threshold Amplitude: 0.625 V
Lead Channel Pacing Threshold Amplitude: 0.75 V
Lead Channel Pacing Threshold Pulse Width: 0.4 ms
Lead Channel Pacing Threshold Pulse Width: 0.4 ms
Lead Channel Setting Pacing Amplitude: 2 V
Lead Channel Setting Pacing Amplitude: 2.5 V
Lead Channel Setting Pacing Pulse Width: 0.4 ms
Lead Channel Setting Sensing Sensitivity: 2 mV

## 2021-01-16 ENCOUNTER — Telehealth: Payer: Self-pay | Admitting: Emergency Medicine

## 2021-01-16 NOTE — Telephone Encounter (Signed)
Attempted to contact patient by home phone with no answer or option to leave message. Attempted to call mobile phone and detailed message left on voicemail about monthly battery checks. Requested patient return call to the device clinic at [phone]. Patient's last home remote on 01/07/2021 showed incomplete transmission. Last remote received with data on 12-05-2020 showing estimated longevity 12 months. Monthly battery checks scheduled. Next monthly battery check scheduled on 02-06-2021.

## 2021-01-18 NOTE — Telephone Encounter (Signed)
Patient called back and she is aware of monthly battery checks

## 2021-01-21 NOTE — Progress Notes (Signed)
Remote pacemaker transmission.   

## 2021-02-06 ENCOUNTER — Ambulatory Visit (INDEPENDENT_AMBULATORY_CARE_PROVIDER_SITE_OTHER): Payer: Medicare Other

## 2021-02-06 DIAGNOSIS — I442 Atrioventricular block, complete: Secondary | ICD-10-CM

## 2021-02-09 LAB — CUP PACEART REMOTE DEVICE CHECK
Battery Impedance: 4080 Ohm
Battery Remaining Longevity: 11 mo
Battery Voltage: 2.69 V
Brady Statistic AP VP Percent: 37 %
Brady Statistic AP VS Percent: 0 %
Brady Statistic AS VP Percent: 63 %
Brady Statistic AS VS Percent: 0 %
Date Time Interrogation Session: 20220211165629
Implantable Lead Implant Date: 20120816
Implantable Lead Implant Date: 20120816
Implantable Lead Location: 753859
Implantable Lead Location: 753860
Implantable Lead Model: 5076
Implantable Lead Model: 5076
Implantable Pulse Generator Implant Date: 20120816
Lead Channel Impedance Value: 482 Ohm
Lead Channel Impedance Value: 640 Ohm
Lead Channel Pacing Threshold Amplitude: 0.5 V
Lead Channel Pacing Threshold Amplitude: 0.875 V
Lead Channel Pacing Threshold Pulse Width: 0.4 ms
Lead Channel Pacing Threshold Pulse Width: 0.4 ms
Lead Channel Setting Pacing Amplitude: 2 V
Lead Channel Setting Pacing Amplitude: 2.5 V
Lead Channel Setting Pacing Pulse Width: 0.4 ms
Lead Channel Setting Sensing Sensitivity: 2 mV

## 2021-02-13 NOTE — Addendum Note (Signed)
Addended by: Cheri Kearns A on: 02/13/2021 08:49 AM   Modules accepted: Level of Service

## 2021-02-13 NOTE — Progress Notes (Signed)
Remote pacemaker transmission.   

## 2021-03-07 ENCOUNTER — Ambulatory Visit (INDEPENDENT_AMBULATORY_CARE_PROVIDER_SITE_OTHER): Payer: Medicare Other

## 2021-03-07 DIAGNOSIS — I442 Atrioventricular block, complete: Secondary | ICD-10-CM | POA: Diagnosis not present

## 2021-03-10 LAB — CUP PACEART REMOTE DEVICE CHECK
Battery Impedance: 4355 Ohm
Battery Remaining Longevity: 10 mo
Battery Voltage: 2.67 V
Brady Statistic AP VP Percent: 37 %
Brady Statistic AP VS Percent: 0 %
Brady Statistic AS VP Percent: 63 %
Brady Statistic AS VS Percent: 0 %
Date Time Interrogation Session: 20220310133638
Implantable Lead Implant Date: 20120816
Implantable Lead Implant Date: 20120816
Implantable Lead Location: 753859
Implantable Lead Location: 753860
Implantable Lead Model: 5076
Implantable Lead Model: 5076
Implantable Pulse Generator Implant Date: 20120816
Lead Channel Impedance Value: 462 Ohm
Lead Channel Impedance Value: 659 Ohm
Lead Channel Pacing Threshold Amplitude: 0.5 V
Lead Channel Pacing Threshold Amplitude: 0.75 V
Lead Channel Pacing Threshold Pulse Width: 0.4 ms
Lead Channel Pacing Threshold Pulse Width: 0.4 ms
Lead Channel Setting Pacing Amplitude: 2 V
Lead Channel Setting Pacing Amplitude: 2.5 V
Lead Channel Setting Pacing Pulse Width: 0.4 ms
Lead Channel Setting Sensing Sensitivity: 2 mV

## 2021-03-15 NOTE — Progress Notes (Signed)
Remote pacemaker transmission.   

## 2021-04-08 ENCOUNTER — Ambulatory Visit (INDEPENDENT_AMBULATORY_CARE_PROVIDER_SITE_OTHER): Payer: Medicare Other

## 2021-04-08 DIAGNOSIS — I442 Atrioventricular block, complete: Secondary | ICD-10-CM

## 2021-04-10 LAB — CUP PACEART REMOTE DEVICE CHECK
Battery Impedance: 4480 Ohm
Battery Remaining Longevity: 9 mo
Battery Voltage: 2.67 V
Brady Statistic AP VP Percent: 37 %
Brady Statistic AP VS Percent: 0 %
Brady Statistic AS VP Percent: 63 %
Brady Statistic AS VS Percent: 0 %
Date Time Interrogation Session: 20220411093526
Implantable Lead Implant Date: 20120816
Implantable Lead Implant Date: 20120816
Implantable Lead Location: 753859
Implantable Lead Location: 753860
Implantable Lead Model: 5076
Implantable Lead Model: 5076
Implantable Pulse Generator Implant Date: 20120816
Lead Channel Impedance Value: 500 Ohm
Lead Channel Impedance Value: 642 Ohm
Lead Channel Pacing Threshold Amplitude: 0.5 V
Lead Channel Pacing Threshold Amplitude: 0.875 V
Lead Channel Pacing Threshold Pulse Width: 0.4 ms
Lead Channel Pacing Threshold Pulse Width: 0.4 ms
Lead Channel Setting Pacing Amplitude: 2 V
Lead Channel Setting Pacing Amplitude: 2.5 V
Lead Channel Setting Pacing Pulse Width: 0.4 ms
Lead Channel Setting Sensing Sensitivity: 2 mV

## 2021-04-22 NOTE — Addendum Note (Signed)
Addended by: Cheri Kearns A on: 04/22/2021 02:08 PM   Modules accepted: Level of Service

## 2021-04-22 NOTE — Progress Notes (Signed)
Remote pacemaker transmission.   

## 2021-05-08 ENCOUNTER — Ambulatory Visit (INDEPENDENT_AMBULATORY_CARE_PROVIDER_SITE_OTHER): Payer: Medicare Other

## 2021-05-08 DIAGNOSIS — I442 Atrioventricular block, complete: Secondary | ICD-10-CM

## 2021-05-09 LAB — CUP PACEART REMOTE DEVICE CHECK
Battery Impedance: 4683 Ohm
Battery Remaining Longevity: 8 mo
Battery Voltage: 2.67 V
Brady Statistic AP VP Percent: 37 %
Brady Statistic AP VS Percent: 0 %
Brady Statistic AS VP Percent: 63 %
Brady Statistic AS VS Percent: 0 %
Date Time Interrogation Session: 20220511093046
Implantable Lead Implant Date: 20120816
Implantable Lead Implant Date: 20120816
Implantable Lead Location: 753859
Implantable Lead Location: 753860
Implantable Lead Model: 5076
Implantable Lead Model: 5076
Implantable Pulse Generator Implant Date: 20120816
Lead Channel Impedance Value: 471 Ohm
Lead Channel Impedance Value: 680 Ohm
Lead Channel Pacing Threshold Amplitude: 0.5 V
Lead Channel Pacing Threshold Amplitude: 0.875 V
Lead Channel Pacing Threshold Pulse Width: 0.4 ms
Lead Channel Pacing Threshold Pulse Width: 0.4 ms
Lead Channel Setting Pacing Amplitude: 2 V
Lead Channel Setting Pacing Amplitude: 2.5 V
Lead Channel Setting Pacing Pulse Width: 0.4 ms
Lead Channel Setting Sensing Sensitivity: 2 mV

## 2021-05-16 ENCOUNTER — Other Ambulatory Visit: Payer: Self-pay | Admitting: Internal Medicine

## 2021-05-16 DIAGNOSIS — Z1231 Encounter for screening mammogram for malignant neoplasm of breast: Secondary | ICD-10-CM

## 2021-05-31 NOTE — Addendum Note (Signed)
Addended by: Cheri Kearns A on: 05/31/2021 08:40 AM   Modules accepted: Level of Service

## 2021-05-31 NOTE — Progress Notes (Signed)
Remote pacemaker transmission.   

## 2021-06-06 ENCOUNTER — Ambulatory Visit (INDEPENDENT_AMBULATORY_CARE_PROVIDER_SITE_OTHER): Payer: Medicare Other

## 2021-06-06 DIAGNOSIS — I442 Atrioventricular block, complete: Secondary | ICD-10-CM

## 2021-06-06 LAB — CUP PACEART REMOTE DEVICE CHECK
Battery Impedance: 4683 Ohm
Battery Remaining Longevity: 8 mo
Battery Voltage: 2.67 V
Brady Statistic AP VP Percent: 37 %
Brady Statistic AP VS Percent: 0 %
Brady Statistic AS VP Percent: 63 %
Brady Statistic AS VS Percent: 0 %
Date Time Interrogation Session: 20220609101449
Implantable Lead Implant Date: 20120816
Implantable Lead Implant Date: 20120816
Implantable Lead Location: 753859
Implantable Lead Location: 753860
Implantable Lead Model: 5076
Implantable Lead Model: 5076
Implantable Pulse Generator Implant Date: 20120816
Lead Channel Impedance Value: 438 Ohm
Lead Channel Impedance Value: 645 Ohm
Lead Channel Pacing Threshold Amplitude: 0.5 V
Lead Channel Pacing Threshold Amplitude: 0.875 V
Lead Channel Pacing Threshold Pulse Width: 0.4 ms
Lead Channel Pacing Threshold Pulse Width: 0.4 ms
Lead Channel Setting Pacing Amplitude: 2 V
Lead Channel Setting Pacing Amplitude: 2.5 V
Lead Channel Setting Pacing Pulse Width: 0.4 ms
Lead Channel Setting Sensing Sensitivity: 2 mV

## 2021-06-28 NOTE — Progress Notes (Signed)
Remote pacemaker transmission.   

## 2021-07-02 ENCOUNTER — Other Ambulatory Visit: Payer: Self-pay

## 2021-07-02 ENCOUNTER — Ambulatory Visit
Admission: RE | Admit: 2021-07-02 | Discharge: 2021-07-02 | Disposition: A | Discharge status: Home / Self Care | Payer: Medicare Other | Source: Ambulatory Visit | Attending: Internal Medicine | Admitting: Internal Medicine

## 2021-07-02 DIAGNOSIS — Z1231 Encounter for screening mammogram for malignant neoplasm of breast: Secondary | ICD-10-CM | POA: Diagnosis not present

## 2021-07-08 ENCOUNTER — Ambulatory Visit (INDEPENDENT_AMBULATORY_CARE_PROVIDER_SITE_OTHER): Payer: Medicare Other

## 2021-07-08 DIAGNOSIS — I442 Atrioventricular block, complete: Secondary | ICD-10-CM

## 2021-07-08 LAB — CUP PACEART REMOTE DEVICE CHECK
Battery Impedance: 5195 Ohm
Battery Remaining Longevity: 6 mo
Battery Voltage: 2.66 V
Brady Statistic AP VP Percent: 37 %
Brady Statistic AP VS Percent: 0 %
Brady Statistic AS VP Percent: 63 %
Brady Statistic AS VS Percent: 0 %
Date Time Interrogation Session: 20220711100358
Implantable Lead Implant Date: 20120816
Implantable Lead Implant Date: 20120816
Implantable Lead Location: 753859
Implantable Lead Location: 753860
Implantable Lead Model: 5076
Implantable Lead Model: 5076
Implantable Pulse Generator Implant Date: 20120816
Lead Channel Impedance Value: 458 Ohm
Lead Channel Impedance Value: 697 Ohm
Lead Channel Pacing Threshold Amplitude: 0.5 V
Lead Channel Pacing Threshold Amplitude: 0.875 V
Lead Channel Pacing Threshold Pulse Width: 0.4 ms
Lead Channel Pacing Threshold Pulse Width: 0.4 ms
Lead Channel Setting Pacing Amplitude: 2 V
Lead Channel Setting Pacing Amplitude: 2.5 V
Lead Channel Setting Pacing Pulse Width: 0.4 ms
Lead Channel Setting Sensing Sensitivity: 2 mV

## 2021-07-09 ENCOUNTER — Other Ambulatory Visit: Payer: Self-pay | Admitting: Internal Medicine

## 2021-07-09 DIAGNOSIS — N6489 Other specified disorders of breast: Secondary | ICD-10-CM

## 2021-07-09 DIAGNOSIS — R928 Other abnormal and inconclusive findings on diagnostic imaging of breast: Secondary | ICD-10-CM

## 2021-07-11 ENCOUNTER — Ambulatory Visit
Admission: RE | Admit: 2021-07-11 | Discharge: 2021-07-11 | Disposition: A | Discharge status: Home / Self Care | Payer: Medicare Other | Source: Ambulatory Visit | Attending: Internal Medicine | Admitting: Internal Medicine

## 2021-07-11 ENCOUNTER — Other Ambulatory Visit: Payer: Self-pay

## 2021-07-11 DIAGNOSIS — R928 Other abnormal and inconclusive findings on diagnostic imaging of breast: Secondary | ICD-10-CM

## 2021-07-11 DIAGNOSIS — N6489 Other specified disorders of breast: Secondary | ICD-10-CM | POA: Diagnosis present

## 2021-07-31 NOTE — Progress Notes (Signed)
Remote pacemaker transmission.   

## 2021-08-07 ENCOUNTER — Ambulatory Visit (INDEPENDENT_AMBULATORY_CARE_PROVIDER_SITE_OTHER): Payer: Medicare Other

## 2021-08-07 DIAGNOSIS — I442 Atrioventricular block, complete: Secondary | ICD-10-CM

## 2021-08-07 LAB — CUP PACEART REMOTE DEVICE CHECK
Battery Impedance: 5283 Ohm
Battery Remaining Longevity: 5 mo
Battery Voltage: 2.66 V
Brady Statistic AP VP Percent: 37 %
Brady Statistic AP VS Percent: 0 %
Brady Statistic AS VP Percent: 63 %
Brady Statistic AS VS Percent: 0 %
Date Time Interrogation Session: 20220810112653
Implantable Lead Implant Date: 20120816
Implantable Lead Implant Date: 20120816
Implantable Lead Location: 753859
Implantable Lead Location: 753860
Implantable Lead Model: 5076
Implantable Lead Model: 5076
Implantable Pulse Generator Implant Date: 20120816
Lead Channel Impedance Value: 458 Ohm
Lead Channel Impedance Value: 682 Ohm
Lead Channel Pacing Threshold Amplitude: 0.625 V
Lead Channel Pacing Threshold Amplitude: 0.875 V
Lead Channel Pacing Threshold Pulse Width: 0.4 ms
Lead Channel Pacing Threshold Pulse Width: 0.4 ms
Lead Channel Setting Pacing Amplitude: 2 V
Lead Channel Setting Pacing Amplitude: 2.5 V
Lead Channel Setting Pacing Pulse Width: 0.4 ms
Lead Channel Setting Sensing Sensitivity: 2 mV

## 2021-08-29 NOTE — Progress Notes (Signed)
Remote pacemaker transmission.   

## 2021-09-05 ENCOUNTER — Ambulatory Visit (INDEPENDENT_AMBULATORY_CARE_PROVIDER_SITE_OTHER): Payer: Medicare Other

## 2021-09-05 DIAGNOSIS — I442 Atrioventricular block, complete: Secondary | ICD-10-CM | POA: Diagnosis not present

## 2021-09-06 LAB — CUP PACEART REMOTE DEVICE CHECK
Battery Impedance: 5652 Ohm
Battery Remaining Longevity: 4 mo
Battery Voltage: 2.65 V
Brady Statistic AP VP Percent: 37 %
Brady Statistic AP VS Percent: 0 %
Brady Statistic AS VP Percent: 63 %
Brady Statistic AS VS Percent: 0 %
Date Time Interrogation Session: 20220908102325
Implantable Lead Implant Date: 20120816
Implantable Lead Implant Date: 20120816
Implantable Lead Location: 753859
Implantable Lead Location: 753860
Implantable Lead Model: 5076
Implantable Lead Model: 5076
Implantable Pulse Generator Implant Date: 20120816
Lead Channel Impedance Value: 460 Ohm
Lead Channel Impedance Value: 685 Ohm
Lead Channel Pacing Threshold Amplitude: 0.625 V
Lead Channel Pacing Threshold Amplitude: 0.875 V
Lead Channel Pacing Threshold Pulse Width: 0.4 ms
Lead Channel Pacing Threshold Pulse Width: 0.4 ms
Lead Channel Setting Pacing Amplitude: 2 V
Lead Channel Setting Pacing Amplitude: 2.5 V
Lead Channel Setting Pacing Pulse Width: 0.4 ms
Lead Channel Setting Sensing Sensitivity: 2 mV

## 2021-09-13 NOTE — Progress Notes (Signed)
Remote pacemaker transmission.   

## 2021-10-07 ENCOUNTER — Ambulatory Visit (INDEPENDENT_AMBULATORY_CARE_PROVIDER_SITE_OTHER): Payer: Medicare Other

## 2021-10-07 DIAGNOSIS — I442 Atrioventricular block, complete: Secondary | ICD-10-CM

## 2021-10-09 LAB — CUP PACEART REMOTE DEVICE CHECK
Battery Impedance: 6116 Ohm
Battery Remaining Longevity: 2 mo
Battery Voltage: 2.63 V
Brady Statistic AP VP Percent: 37 %
Brady Statistic AP VS Percent: 0 %
Brady Statistic AS VP Percent: 63 %
Brady Statistic AS VS Percent: 0 %
Date Time Interrogation Session: 20221010100203
Implantable Lead Implant Date: 20120816
Implantable Lead Implant Date: 20120816
Implantable Lead Location: 753859
Implantable Lead Location: 753860
Implantable Lead Model: 5076
Implantable Lead Model: 5076
Implantable Pulse Generator Implant Date: 20120816
Lead Channel Impedance Value: 451 Ohm
Lead Channel Impedance Value: 719 Ohm
Lead Channel Pacing Threshold Amplitude: 0.625 V
Lead Channel Pacing Threshold Amplitude: 0.875 V
Lead Channel Pacing Threshold Pulse Width: 0.4 ms
Lead Channel Pacing Threshold Pulse Width: 0.4 ms
Lead Channel Setting Pacing Amplitude: 2 V
Lead Channel Setting Pacing Amplitude: 2.5 V
Lead Channel Setting Pacing Pulse Width: 0.4 ms
Lead Channel Setting Sensing Sensitivity: 2 mV

## 2021-10-16 NOTE — Addendum Note (Signed)
Addended by: Cheri Kearns A on: 10/16/2021 09:51 AM   Modules accepted: Level of Service

## 2021-10-16 NOTE — Progress Notes (Signed)
Remote pacemaker transmission.   

## 2021-10-21 NOTE — Progress Notes (Signed)
Date:  10/22/2021   ID:  LYNNIE Hunter, DOB 11-02-1941, MRN [id number]  Patient Location:  [place] [address]   Provider location:   [facility], [place] office  PCP:  [name], MD  Cardiologist:  Arvid Right [facility]  Chief Complaint  Patient presents with   12 month follow up     "Doing well." Medications reviewed by the patient verbally.     History of Present Illness:    Katrina Hunter is a 80 y.o. female w/ past medical history of Complete heart block, pacer, followed by Dr. 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 Lovena Le  CT 10/2019, reviewed in detail  ascending thoracic aorta :  stable diameter and by CTA measures approximately 4.4-4.5 cm in greatest diameter.  Aortic atherosclerosis arch and descending Previously estimated to be 4.8 cm in 2019  Hesitant to have repeat CT scans Has indicated she does not want surgery for aorta even if it gets bigger  Regular exercise program  No SOB, no chest pain on exertion  myalagia on crestor Changed to pravastatin, And zetia  Lab work reviewed Total chol 176, LDL 98 Prior total chol 269 in 02/2015 Potassium 3.7 TSH 2.2  EKG personally reviewed by myself on todays visit Normal sinus rhythm/paced rate 67 bpm   Prior CV studies:   The following studies were reviewed today:  CT chest  07/2018 Stable 4.8 cm ascending thoracic aortic aneurysm. Ascending thoracic aortic aneurysm.  CT scan 2012: aorta dilation was 4.4 cm CT scan 2013: Aorta  dilation 4.8 cm CT scan: 2014: 4.7 cm CT scan 11/2015: 5.1 x 4.7 cm CT scan 12/2016: 5.1 cm CT scan 01/2018 aorta dilation 4.7   Past Medical History:  Diagnosis Date   Aneurysm ([place])    aortic    AV block, complete (HCC)    PPM placed 07/2011 [facility]   Headache    migraines   Heart murmur    History of prolapse of bladder    HLD (hyperlipidemia)    HTN (hypertension)    Hypothyroidism     no rx   Lung cancer ([place]) dx'd 06/2017   Pacemaker 12   Pulmonary nodule, left 04/22/2017   From order notes   Past Surgical History:  Procedure Laterality Date   BLADDER SURGERY  01/03/2017   LYMPH NODE DISSECTION Left 06/29/2017   Procedure: LYMPH NODE DISSECTION LEFT LUNG;  Surgeon: Melrose Nakayama, MD;  Location: [place];  Service: Thoracic;  Laterality: Left;   PACEMAKER INSERTION  07/2011   [name]   TONSILLECTOMY  age 36   [place] (VATS)/WEDGE RESECTION Left 06/29/2017   Procedure: VIDEO ASSISTED THORACOSCOPY (VATS)/WEDGE RESECTION;  Surgeon: Melrose Nakayama, MD;  Location: [place];  Service: Thoracic;  Laterality: Left;   VIDEO BRONCHOSCOPY WITH ENDOBRONCHIAL NAVIGATION N/A 12/05/2013   Procedure: VIDEO BRONCHOSCOPY WITH ENDOBRONCHIAL NAVIGATION;  Surgeon: Collene Gobble, MD;  Location: [place];  Service: Thoracic;  Laterality: N/A;     Current Meds  Medication  Sig   amLODipine (NORVASC) 10 MG tablet Take 10 mg by mouth daily.   cholecalciferol (VITAMIN D) 1000 UNITS tablet Take 2,000 Units by mouth daily.    Cyanocobalamin (B-12) 3000 MCG CAPS Take 3,000 mcg by mouth daily.   ezetimibe (ZETIA) 10 MG tablet Take 10 mg by mouth daily.   levothyroxine (SYNTHROID) 50 MCG tablet Take 50 mcg by mouth daily before breakfast.   potassium chloride (KLOR-CON) 8 MEQ tablet Take 8 mEq by mouth 2 (two) times daily.   pravastatin (PRAVACHOL) 20 MG tablet Take 20 mg by mouth at bedtime.    triamcinolone (NASACORT) 55 MCG/ACT AERO nasal inhaler Place into  the nose.   triamterene-hydrochlorothiazide (MAXZIDE-25) 37.5-25 MG per tablet Take 0.5 tablets by mouth daily.      Allergies:   Crestor [rosuvastatin calcium], Ace inhibitors, and Sulfonamide derivatives   Social History   Tobacco Use   Smoking status: Never   Smokeless tobacco: Never  Vaping Use   Vaping Use: Never used  Substance Use Topics   Alcohol use: No   Drug use: No     Family Hx: The patient's family history includes Breast cancer (age of onset: 69) in her mother; Heart disease in her father.  ROS:   Please see the history of present illness.    Review of Systems  Constitutional: Negative.   HENT: Negative.    Respiratory: Negative.    Cardiovascular: Negative.   Gastrointestinal: Negative.   Musculoskeletal: Negative.   Neurological: Negative.   Psychiatric/Behavioral: Negative.    All other systems reviewed and are negative.   Labs/Other Tests and Data Reviewed:    Recent Labs: No results found for requested labs within last 8760 hours.   Recent Lipid Panel No results found for: CHOL, TRIG, HDL, CHOLHDL, LDLCALC, LDLDIRECT  Wt Readings from Last 3 Encounters:  10/22/21 124 lb 4 oz (56.4 kg)  10/22/20 133 lb 4 oz (60.4 kg)  10/18/19 134 lb (60.8 kg)     Exam:    BP 120/70 (BP Location: Left Arm, Patient Position: Sitting, Cuff Size: Normal)   Pulse 67   Ht 5' 3.5" (1.613 m)   Wt 124 lb 4 oz (56.4 kg)   SpO2 97%   BMI 21.66 kg/m  Constitutional:  oriented to person, place, and time. No distress.  HENT:  Head: Grossly normal Eyes:  no discharge. No scleral icterus.  Neck: No JVD, no carotid bruits  Cardiovascular: Regular rate and rhythm, 2+ LSB-RSB Pulmonary/Chest: Clear to auscultation bilaterally, no wheezes or rails Abdominal: Soft.  no distension.  no tenderness.  Musculoskeletal: Normal range of motion Neurological:  normal muscle tone. Coordination normal. No atrophy Skin: Skin warm and dry Psychiatric: normal affect,  pleasant  ASSESSMENT & PLAN:    Thoracic ascending aortic aneurysm (HCC) Stable for the past 10 years Echo with evaluate, would prefer no repeat CT scans Does not want surgery even if aorta gets more dilated  Murmur No echo in the past 6 years More notable on exam A to rule out underlying aortic disease given dilated aorta  AV block, complete (HCC) -  Cardiac pacer followed by Dr. Lovena Le  Mixed hyperlipidemia Ldl above goal, add PCSK9 inh  Pacemaker Followed by EP Battery not at [facility]  Adenocarcinoma of left lung, stage 1 ([place]) Reports she was non-smoker Had wedge resection Followed with CT, none in 2 years  Complete heart block ([place]) - Followed by Dr. Lovena Le Denies any new symptoms  Aortic atherosclerosis She has  moderate diffuse aortic atherosclerosis on CT scan On statin and zetia, add repatha Goal LDL <70   Total encounter time more than 25 minutes  Greater than 50% was spent in counseling and coordination of care with the patient   Signed, Ida Rogue, MD  10/22/2021 8:18 AM    [place] [facility] [id number], [place], [place] [address]

## 2021-10-22 ENCOUNTER — Other Ambulatory Visit: Payer: Self-pay

## 2021-10-22 ENCOUNTER — Ambulatory Visit: Payer: Medicare Other | Admitting: Cardiovascular Disease

## 2021-10-22 ENCOUNTER — Encounter: Payer: Self-pay | Admitting: Cardiovascular Disease

## 2021-10-22 VITALS — BP 120/70 | HR 67 | Ht 63.5 in | Wt 124.2 lb

## 2021-10-22 DIAGNOSIS — I495 Sick sinus syndrome: Secondary | ICD-10-CM

## 2021-10-22 DIAGNOSIS — I10 Essential (primary) hypertension: Secondary | ICD-10-CM

## 2021-10-22 DIAGNOSIS — Z79899 Other long term (current) drug therapy: Secondary | ICD-10-CM

## 2021-10-22 DIAGNOSIS — I442 Atrioventricular block, complete: Secondary | ICD-10-CM | POA: Diagnosis not present

## 2021-10-22 DIAGNOSIS — C3492 Malignant neoplasm of unspecified part of left bronchus or lung: Secondary | ICD-10-CM

## 2021-10-22 DIAGNOSIS — I7121 Aneurysm of the ascending aorta, without rupture: Secondary | ICD-10-CM | POA: Diagnosis not present

## 2021-10-22 DIAGNOSIS — R011 Cardiac murmur, unspecified: Secondary | ICD-10-CM

## 2021-10-22 DIAGNOSIS — I700 Atherosclerosis of aorta: Secondary | ICD-10-CM

## 2021-10-22 DIAGNOSIS — E782 Mixed hyperlipidemia: Secondary | ICD-10-CM

## 2021-10-22 DIAGNOSIS — I77810 Thoracic aortic ectasia: Secondary | ICD-10-CM

## 2021-10-22 MED ORDER — REPATHA SURECLICK 140 MG/ML ~~LOC~~ SOAJ: 1.0000 "pen " | SUBCUTANEOUS | 11 refills | Status: DC

## 2021-10-22 MED ORDER — TRIAMTERENE-HCTZ 37.5-25 MG PO TABS: 0.5000 | ORAL_TABLET | Freq: Every day | ORAL | 3 refills | Status: DC

## 2021-10-22 MED ORDER — AMLODIPINE BESYLATE 10 MG PO TABS: 10.0000 mg | ORAL_TABLET | Freq: Every day | ORAL | 3 refills | Status: DC

## 2021-10-22 MED ORDER — PRAVASTATIN SODIUM 20 MG PO TABS: 20.0000 mg | ORAL_TABLET | Freq: Every day | ORAL | 3 refills | Status: DC

## 2021-10-22 MED ORDER — EZETIMIBE 10 MG PO TABS
10.0000 mg | ORAL_TABLET | Freq: Every day | ORAL | 3 refills | Status: DC
Start: 2021-10-22 — End: 2022-11-18

## 2021-10-22 NOTE — Patient Instructions (Addendum)
Medication Instructions:  Please START Repatha 140 injection  every two weeks 2.  Patient assistance application  A. Complete and bring back to office  B. Call [phone] or [phone]  C. ([place])  If you need a refill on your cardiac medications before your next appointment, please call your pharmacy.   Lab work: No new labs needed  Testing/Procedures: ECHO Your physician has requested that you have an echocardiogram. Echocardiography is a painless test that uses sound waves to create images of your heart. It provides your doctor with information about the size and shape of your heart and how well your heart's chambers and valves are working. This procedure takes approximately one hour. There are no restrictions for this procedure.  There is a possibility that an IV may need to be started during your test to inject an image enhancing agent. This is done to obtain more optimal pictures of your heart. Therefore we ask that you do at least drink some water prior to coming in to hydrate your veins.    Follow-Up: At [facility], you and your health needs are our priority.  As part of our continuing mission to provide you with exceptional heart care, we have created designated Provider Care Teams.  These Care Teams include your primary Cardiologist (physician) and Advanced Practice Providers (APPs -  Physician Assistants and Nurse Practitioners) who all work together to provide you with the care you need, when you need it.  You will need a follow up appointment in 12 months  Providers on your designated Care Team:   Murray Hodgkins, NP Christell Faith, PA-C Cadence Kathlen Mody, [place]  COVID-19 Vaccine Information can be found at: ShippingScam.co.uk For questions related to vaccine distribution or appointments, please email vaccine@[email or web address] .[email or web address] or call [phone].

## 2021-10-23 ENCOUNTER — Other Ambulatory Visit (INDEPENDENT_AMBULATORY_CARE_PROVIDER_SITE_OTHER): Payer: Medicare Other

## 2021-10-23 DIAGNOSIS — Z79899 Other long term (current) drug therapy: Secondary | ICD-10-CM

## 2021-10-23 DIAGNOSIS — E782 Mixed hyperlipidemia: Secondary | ICD-10-CM

## 2021-10-23 DIAGNOSIS — I700 Atherosclerosis of aorta: Secondary | ICD-10-CM

## 2021-10-24 ENCOUNTER — Telehealth: Payer: Self-pay

## 2021-10-24 LAB — LIPID PANEL
Chol/HDL Ratio: 3.3 ratio (ref 0.0–4.4)
Cholesterol, Total: 173 mg/dL (ref 100–199)
HDL: 52 mg/dL (ref >39)
LDL Chol Calc (NIH): 99 mg/dL (ref 0–99)
Triglycerides: 125 mg/dL (ref 0–149)
VLDL Cholesterol Cal: 22 mg/dL (ref 5–40)

## 2021-10-24 NOTE — Telephone Encounter (Signed)
Prior Authorization completed in covermymeds.[email or web address] for Repatha 140mg /mL. KEY: [id number]  RESPONSE: [employer] is reviewing your PA request. Typically an electronic response will be received within 24-72 hours. To check for an update later, open this request from your dashboard.

## 2021-10-28 ENCOUNTER — Telehealth: Payer: Self-pay | Admitting: Cardiovascular Disease

## 2021-10-28 NOTE — Telephone Encounter (Signed)
Patient dropped off [employer] forms to be completed  Placed in nurse box

## 2021-10-29 NOTE — Telephone Encounter (Signed)
Received PA application Completed provider's portion, attached copy of insurance card and med list Dr. Rockey Situ has signed Form faxed and placed in file cabinet

## 2021-11-06 ENCOUNTER — Ambulatory Visit (INDEPENDENT_AMBULATORY_CARE_PROVIDER_SITE_OTHER): Payer: Medicare Other

## 2021-11-06 ENCOUNTER — Telehealth: Payer: Self-pay

## 2021-11-06 DIAGNOSIS — I495 Sick sinus syndrome: Secondary | ICD-10-CM

## 2021-11-06 LAB — CUP PACEART REMOTE DEVICE CHECK
Battery Impedance: 6528 Ohm
Battery Remaining Longevity: 1 mo
Battery Voltage: 2.64 V
Brady Statistic AP VP Percent: 37 %
Brady Statistic AP VS Percent: 0 %
Brady Statistic AS VP Percent: 63 %
Brady Statistic AS VS Percent: 0 %
Date Time Interrogation Session: 20221109093153
Implantable Lead Implant Date: 20120816
Implantable Lead Implant Date: 20120816
Implantable Lead Location: 753859
Implantable Lead Location: 753860
Implantable Lead Model: 5076
Implantable Lead Model: 5076
Implantable Pulse Generator Implant Date: 20120816
Lead Channel Impedance Value: 452 Ohm
Lead Channel Impedance Value: 709 Ohm
Lead Channel Pacing Threshold Amplitude: 0.5 V
Lead Channel Pacing Threshold Amplitude: 0.875 V
Lead Channel Pacing Threshold Pulse Width: 0.4 ms
Lead Channel Pacing Threshold Pulse Width: 0.4 ms
Lead Channel Setting Pacing Amplitude: 2 V
Lead Channel Setting Pacing Amplitude: 2.5 V
Lead Channel Setting Pacing Pulse Width: 0.4 ms
Lead Channel Setting Sensing Sensitivity: 2 mV

## 2021-11-06 NOTE — Telephone Encounter (Signed)
Faxed received from [place] additional information for PA to be completed  "Prior Authorization Approval Determination for most recent plan year"  Prior Authorization was approved under CoverMyMeds, letter was printed to show "[place] 140MG /ML, use as directed, is approved through 04/24/2022 under your Medicare Part D benefit"  Letter faxed back to [place]  Patient Case ID: [id number]

## 2021-11-15 ENCOUNTER — Telehealth: Payer: Self-pay

## 2021-11-15 NOTE — Telephone Encounter (Signed)
Holiday representative from [place]  Your patient did not meet the following program guidelines: Your patient's insurance plan indicated they have insurance coverage for the medication requested.   For information regarding financial support options that may be available to help your patient's out-of-pocket costs or copayment, please contact Repatha Ready at [phone])

## 2021-11-15 NOTE — Progress Notes (Signed)
Remote pacemaker transmission.   

## 2021-11-18 ENCOUNTER — Other Ambulatory Visit: Payer: Self-pay

## 2021-11-18 ENCOUNTER — Ambulatory Visit (INDEPENDENT_AMBULATORY_CARE_PROVIDER_SITE_OTHER): Payer: Medicare Other

## 2021-11-18 DIAGNOSIS — I77810 Thoracic aortic ectasia: Secondary | ICD-10-CM | POA: Diagnosis not present

## 2021-11-18 DIAGNOSIS — R011 Cardiac murmur, unspecified: Secondary | ICD-10-CM

## 2021-11-18 LAB — ECHOCARDIOGRAM COMPLETE
Area-P 1/2: 2.83 cm2
Calc EF: 60.7 %
S' Lateral: 2.8 cm
Single Plane A2C EF: 61.3 %
Single Plane A4C EF: 60.4 %

## 2021-11-25 ENCOUNTER — Telehealth: Payer: Self-pay

## 2021-11-25 NOTE — Telephone Encounter (Signed)
Able to reach pt regarding her recent ECHO Dr. Rockey Situ had a chance to review her results and advised   "Echo  Normal left and right cardiac function  No significant valve disease  There is moderate dilation of the aorta estimated 4.7 cm,  Would recommend echocardiogram on annual basis to evaluate aorta "  Katrina Hunter very thankful for the phone call of her results, all questions and concerns were address with nothing further at this time. Will see at next schedule f/u appt.

## 2021-12-04 ENCOUNTER — Other Ambulatory Visit: Payer: Medicare Other

## 2021-12-05 ENCOUNTER — Ambulatory Visit (INDEPENDENT_AMBULATORY_CARE_PROVIDER_SITE_OTHER): Payer: Medicare Other

## 2021-12-05 DIAGNOSIS — I495 Sick sinus syndrome: Secondary | ICD-10-CM

## 2021-12-06 LAB — CUP PACEART REMOTE DEVICE CHECK
Battery Impedance: 7143 Ohm
Battery Remaining Longevity: 1 mo — CL
Battery Voltage: 2.61 V
Brady Statistic AP VP Percent: 37 %
Brady Statistic AP VS Percent: 0 %
Brady Statistic AS VP Percent: 63 %
Brady Statistic AS VS Percent: 0 %
Date Time Interrogation Session: 20221208161512
Implantable Lead Implant Date: 20120816
Implantable Lead Implant Date: 20120816
Implantable Lead Location: 753859
Implantable Lead Location: 753860
Implantable Lead Model: 5076
Implantable Lead Model: 5076
Implantable Pulse Generator Implant Date: 20120816
Lead Channel Impedance Value: 463 Ohm
Lead Channel Impedance Value: 711 Ohm
Lead Channel Pacing Threshold Amplitude: 0.625 V
Lead Channel Pacing Threshold Amplitude: 0.875 V
Lead Channel Pacing Threshold Pulse Width: 0.4 ms
Lead Channel Pacing Threshold Pulse Width: 0.4 ms
Lead Channel Setting Pacing Amplitude: 2 V
Lead Channel Setting Pacing Amplitude: 2.5 V
Lead Channel Setting Pacing Pulse Width: 0.4 ms
Lead Channel Setting Sensing Sensitivity: 2 mV

## 2021-12-13 NOTE — Progress Notes (Signed)
Remote pacemaker transmission.   

## 2022-01-01 ENCOUNTER — Telehealth: Payer: Self-pay | Admitting: Cardiovascular Disease

## 2022-01-01 NOTE — Telephone Encounter (Signed)
°*  STAT* If patient is at the pharmacy, call can be transferred to refill team.   1. Which medications need to be refilled? (please list name of each medication and dose if known) Repatha Sureclick 850 mg   2. Which pharmacy/location (including street and city if local pharmacy) is medication to be sent to? [facility] [place]  3. Do they need a 30 day or 90 day supply? 90 day

## 2022-01-02 NOTE — Telephone Encounter (Signed)
Patient states she does not want this medication filled. States that she is going to fill out a patient assistance form due to cost.

## 2022-01-06 ENCOUNTER — Ambulatory Visit (INDEPENDENT_AMBULATORY_CARE_PROVIDER_SITE_OTHER): Payer: Medicare Other

## 2022-01-06 DIAGNOSIS — I495 Sick sinus syndrome: Secondary | ICD-10-CM

## 2022-01-06 LAB — CUP PACEART REMOTE DEVICE CHECK
Battery Impedance: 8155 Ohm
Battery Voltage: 2.61 V
Brady Statistic RV Percent Paced: 100 %
Date Time Interrogation Session: 20230109092630
Implantable Lead Implant Date: 20120816
Implantable Lead Implant Date: 20120816
Implantable Lead Location: 753859
Implantable Lead Location: 753860
Implantable Lead Model: 5076
Implantable Lead Model: 5076
Implantable Pulse Generator Implant Date: 20120816
Lead Channel Impedance Value: 462 Ohm
Lead Channel Impedance Value: 67 Ohm
Lead Channel Setting Pacing Amplitude: 2.5 V
Lead Channel Setting Pacing Pulse Width: 0.4 ms
Lead Channel Setting Sensing Sensitivity: 2 mV

## 2022-01-07 ENCOUNTER — Telehealth: Payer: Self-pay

## 2022-01-07 NOTE — Telephone Encounter (Signed)
Nonbillable.  Scheduled remote reviewed. Normal device function.   The device has reached RRT on 12/09/2021.  Sent to triage. Next remote 02/06/2022.  Kathy Breach, RN, CCDS, [employer]   Patient has not been seen since 2020 and that was a tele visit with Dr. Caryl Comes. Patient has not been seen by EP since 08/07/2011 when she saw Dr. Lovena Le in clinic.   Successful telephone encounter to patient to discuss RRT status. Will route to EP and scheduling for advisement as to who needs to see patient in clinic to discuss gen change.

## 2022-01-10 NOTE — Progress Notes (Deleted)
Electrophysiology Office Note Date: 01/10/2022  ID:  Katrina Hunter, DOB [birth year], MRN [id number]  PCP: [name], MD Primary Cardiologist: None Electrophysiologist: [facility] previously, [place] - Dr. Caryl Comes  CC: Pacemaker follow-up  Katrina Hunter is a 81 y.o. female seen today for Dr. Caryl Comes for routine electrophysiology followup.    Initially implanted by Dr. Lovena Le 2012. Had virtual visit with Dr. Caryl Comes 2020, but otherwise has not been seen EP.   Echo 10/2021 LVEF 55-60%  Seen today for PPM at [facility] as of 12/09/2021.  Since last being seen in our clinic the patient reports doing ***.  she denies chest pain, palpitations, dyspnea, PND, orthopnea, nausea, vomiting, dizziness, syncope, edema, weight gain, or early satiety.  Device History: Medtronic Dual Chamber PPM implanted 2012 for CHB  Past Medical History:  Diagnosis Date   Aneurysm ([place])    aortic    AV block, complete (HCC)    PPM placed 07/2011 [facility]   Headache    migraines   Heart murmur    History of prolapse of bladder    HLD (hyperlipidemia)    HTN (hypertension)    Hypothyroidism     no rx   Lung cancer ([place]) dx'd 06/2017   Pacemaker 12   Pulmonary nodule, left 04/22/2017   From order notes   Past Surgical History:  Procedure Laterality Date   BLADDER SURGERY  01/03/2017   LYMPH NODE DISSECTION Left 06/29/2017   Procedure: LYMPH NODE DISSECTION LEFT LUNG;  Surgeon: Melrose Nakayama, MD;  Location: [place];  Service: Thoracic;  Laterality: Left;   PACEMAKER INSERTION  07/2011   [name]   TONSILLECTOMY  age 67   [place] (VATS)/WEDGE RESECTION Left 06/29/2017   Procedure: VIDEO ASSISTED THORACOSCOPY (VATS)/WEDGE RESECTION;  Surgeon: Melrose Nakayama, MD;  Location: [place];  Service: Thoracic;  Laterality: Left;   [place] N/A 12/05/2013   Procedure: VIDEO BRONCHOSCOPY WITH ENDOBRONCHIAL NAVIGATION;  Surgeon: Collene Gobble, MD;  [facility]: [facility];  Service: Thoracic;  Laterality: N/A;    Current Outpatient Medications  Medication Sig Dispense Refill   amLODipine (NORVASC) 10 MG tablet Take 1 tablet (10 mg total) by mouth daily. 90 tablet 3   cholecalciferol (VITAMIN D) 1000 UNITS tablet Take 2,000 Units by mouth daily.      Cyanocobalamin (B-12) 3000 MCG CAPS Take 3,000 mcg by mouth daily.     Evolocumab (REPATHA SURECLICK) 902 MG/ML SOAJ Inject 1 pen into the skin every 14 (fourteen) days. 2 mL 11   ezetimibe (ZETIA) 10 MG tablet Take 1 tablet (10 mg total) by mouth daily. 90 tablet 3   levothyroxine (SYNTHROID) 50 MCG tablet Take 50 mcg by mouth daily before breakfast.     potassium chloride (KLOR-CON) 8 MEQ tablet Take 8 mEq by mouth 2 (two) times daily.     pravastatin (PRAVACHOL) 20 MG tablet Take 1 tablet (20 mg total) by mouth at bedtime. 90 tablet 3   triamcinolone (NASACORT) 55 MCG/ACT AERO nasal inhaler Place into the nose.     triamterene-hydrochlorothiazide (MAXZIDE-25) 37.5-25 MG tablet Take 0.5 tablets by mouth daily. 45 tablet 3   No current facility-administered medications for this visit.    Allergies:   Ace inhibitors, Crestor [rosuvastatin calcium], and Sulfonamide derivatives   Social History: Social History   Socioeconomic History   Marital status: Married    Spouse name: Not on file   Number of  children: 3   Years of education: Not on file   Highest education level: Not on file  Occupational History   Occupation: Administrator, sports: [employer]  Tobacco Use   Smoking status: Never   Smokeless tobacco: Never  Vaping Use   Vaping Use: Never used  Substance and Sexual Activity   Alcohol use: No   Drug use: No   Sexual activity: Not on file  Other Topics Concern   Not on file  Social History Narrative   Retired.    Social Determinants of Health   Financial Resource Strain: Not on file  Food Insecurity: Not on file  Transportation Needs: Not on file  Physical  Activity: Not on file  Stress: Not on file  Social Connections: Not on file  Intimate Partner Violence: Not on file    Family History: Family History  Problem Relation Age of Onset   Heart disease Father    Breast cancer Mother 62     Review of Systems: All other systems reviewed and are otherwise negative except as noted above.  Physical Exam: There were no vitals filed for this visit.   GEN- The patient is well appearing, alert and oriented x 3 today.   HEENT: normocephalic, atraumatic; sclera clear, conjunctiva pink; hearing intact; oropharynx clear; neck supple  Lungs- Clear to ausculation bilaterally, normal work of breathing.  No wheezes, rales, rhonchi Heart- Regular rate and rhythm, no murmurs, rubs or gallops  GI- soft, non-tender, non-distended, bowel sounds present  Extremities- no clubbing or cyanosis. No edema MS- no significant deformity or atrophy Skin- warm and dry, no rash or lesion; PPM pocket well healed Psych- euthymic mood, full affect Neuro- strength and sensation are intact  PPM Interrogation- reviewed in detail today,  See PACEART report  EKG:  EKG is ordered today. Personal review of ekg ordered today shows ***   Recent Labs: No results found for requested labs within last 8760 hours.   Wt Readings from Last 3 Encounters:  10/22/21 124 lb 4 oz (56.4 kg)  10/22/20 133 lb 4 oz (60.4 kg)  10/18/19 134 lb (60.8 kg)     Other studies Reviewed: Additional studies/ records that were reviewed today include: Previous EP office notes, Previous remote checks, Most recent labwork.   Assessment and Plan:  1. CHB s/p Medtronic PPM  Normal PPM function See Pace Art report No changes today  2. Subclinical atrial fibrillation <1% by most recent transmission with episodes (now at [facility])  3. Hypokalemia Labs today  Current medicines are reviewed at length with the patient today.    Labs/ tests ordered today include: *** No orders of the defined  types were placed in this encounter.   Disposition:   Follow up with {Blank single:19197::"Dr. Allred","Dr. Arlan Organ. Klein","Dr. Camnitz","Dr. Lambert","EP APP"} in {Blank single:19197::"2 weeks","4 weeks","3 months","6 months","12 months","as usual post gen change"}    Signed, Annamaria Helling  01/10/2022 10:13 AM  [facility] [address] [place] [address] [phone] (office) [phone] (fax)

## 2022-01-13 ENCOUNTER — Encounter: Payer: Medicare Other | Admitting: Student

## 2022-01-13 DIAGNOSIS — I10 Essential (primary) hypertension: Secondary | ICD-10-CM

## 2022-01-13 DIAGNOSIS — I442 Atrioventricular block, complete: Secondary | ICD-10-CM

## 2022-01-14 NOTE — Progress Notes (Signed)
Remote pacemaker transmission.   

## 2022-01-22 NOTE — Progress Notes (Signed)
Cardiology Office Note Date:  01/22/2022  Patient ID:  Katrina Hunter, Katrina Hunter 05-14-1941, MRN [id number] PCP:  [name], MD  Cardiologist:  Dr. Rockey Situ Electrophysiologist: Dr. Lovena Le >> Dr. Caryl Comes    Chief Complaint:  device ERI  History of Present Illness: Katrina Hunter is a 81 y.o. female with history of lung Ca s/p left wedge resection, HTN, HLD, CHB w/PPM  She comes in today to be seen for Dr. Caryl Comes, last seen by him April 2020 via tele health to establish care at the [facility] office for pacer management, closer to her.  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 Rockey Situ, saw him last Oct 2022, aorta stable at last CT in 2020, the pt did not want further CTs and would not pursue surgery even if indicated. She was doing wll, planned for an echo to f/u on a murmur Repatha added for lipid management   COVID 01/10/22   TODAY She is doing well, recovered from her COVID infection. She denies any CP, palpitations, cardiac awareness Was by her PMD last week and he suggested with lower BP to hold off on taking her amlodipine for a few days, and resume once improved. She denies any SOB No near syncope or syncope.  Device information MDT dual chamber PPM implanted 08/14/2011   Past Medical History:  Diagnosis Date   Aneurysm ([place])    aortic    AV block, complete (HCC)    PPM placed 07/2011 [facility]   Headache    migraines   Heart murmur    History of prolapse of bladder    HLD (hyperlipidemia)    HTN (hypertension)    Hypothyroidism     no rx   Lung cancer ([place]) dx'd 06/2017   Pacemaker 12   Pulmonary nodule, left 04/22/2017   From order notes    Past Surgical History:  Procedure Laterality Date   BLADDER SURGERY  01/03/2017   LYMPH NODE DISSECTION Left 06/29/2017   Procedure: LYMPH NODE DISSECTION LEFT LUNG;  Surgeon: Melrose Nakayama, MD;  Location: [place];  Service: Thoracic;   Laterality: Left;   PACEMAKER INSERTION  07/2011   [name]   TONSILLECTOMY  age 57   [place] (VATS)/WEDGE RESECTION Left 06/29/2017   Procedure: VIDEO ASSISTED THORACOSCOPY (VATS)/WEDGE RESECTION;  Surgeon: Melrose Nakayama, MD;  Location: [place];  Service: Thoracic;  Laterality: Left;   [place] N/A 12/05/2013   Procedure: VIDEO BRONCHOSCOPY WITH ENDOBRONCHIAL NAVIGATION;  Surgeon: Collene Gobble, MD;  [facility];  Service: Thoracic;  Laterality: N/A;    Current Outpatient Medications  Medication Sig Dispense Refill   amLODipine (NORVASC) 10 MG tablet Take 1 tablet (10 mg total) by mouth daily. 90 tablet 3   cholecalciferol (VITAMIN D) 1000 UNITS tablet Take 2,000 Units by mouth daily.      Cyanocobalamin (B-12) 3000 MCG CAPS Take 3,000 mcg by mouth daily.     Evolocumab (REPATHA SURECLICK) 170 MG/ML SOAJ Inject 1 pen into the skin every 14 (fourteen) days. 2 mL 11   ezetimibe (ZETIA) 10 MG tablet Take 1 tablet (10 mg total) by mouth daily. 90 tablet 3   levothyroxine (SYNTHROID) 50 MCG tablet Take 50 mcg by mouth daily before breakfast.     potassium chloride (KLOR-CON) 8 MEQ tablet Take 8 mEq by mouth 2 (  two) times daily.     pravastatin (PRAVACHOL) 20 MG tablet Take 1 tablet (20 mg total) by mouth at bedtime. 90 tablet 3   triamcinolone (NASACORT) 55 MCG/ACT AERO nasal inhaler Place into the nose.     triamterene-hydrochlorothiazide (MAXZIDE-25) 37.5-25 MG tablet Take 0.5 tablets by mouth daily. 45 tablet 3   No current facility-administered medications for this visit.    Allergies:   Ace inhibitors, Crestor [rosuvastatin calcium], and Sulfonamide derivatives   Social History:  The patient  reports that she has never smoked. She has never used smokeless tobacco. She reports that she does not drink alcohol and does not use drugs.   Family History:  The patient's family history includes Breast cancer  (age of onset: 75) in her mother; Heart disease in her father.  ROS:  Please see the history of present illness.    All other systems are reviewed and otherwise negative.   PHYSICAL EXAM:  VS:  There were no vitals taken for this visit. BMI: There is no height or weight on file to calculate BMI. Well nourished, well developed, in no acute distress HEENT: normocephalic, atraumatic Neck: no JVD, carotid bruits or masses Cardiac:   RRR; no significant murmurs, no rubs, or gallops Lungs:   CTA b/l, no wheezing, rhonchi or rales Abd: soft, nontender MS: no deformity or atrophy Ext: no edema Skin: warm and dry, no rash Neuro:  No gross deficits appreciated Psych: euthymic mood, full affect  PPM site is stable, no tethering or discomfort   EKG:  Done today and reviewed by myself shows  Asynchronous V pacing, 65  Device interrogation done today and reviewed by myself:  RRT reached 12/09/21 No R waves at 40 today though did come in with a single intrinsic beat with LOC Lead measurements are OK  11/18/2021: TTE IMPRESSIONS   1. Left ventricular ejection fraction, by estimation, is 55 to 60%. Left  ventricular ejection fraction by 2D MOD biplane is 60.7 %. The left  ventricle has normal function. The left ventricle has no regional wall  motion abnormalities. There is mild left  ventricular hypertrophy of the basal-septal segment. Left ventricular  diastolic parameters are consistent with Grade I diastolic dysfunction  (impaired relaxation). The average left ventricular global longitudinal  strain is -18.8 %. The global  longitudinal strain is normal.   2. Right ventricular systolic function is normal. The right ventricular  size is normal.   3. The mitral valve is normal in structure. Mild mitral valve  regurgitation.   4. The aortic valve is tricuspid. Aortic valve regurgitation is not  visualized. Aortic valve sclerosis is present, with no evidence of aortic  valve stenosis.    5. Aortic dilatation noted. There is mild dilatation of the aortic root,  measuring 39 mm. There is moderate dilatation of the ascending aorta,  measuring 47 mm.   6. The inferior vena cava is normal in size with greater than 50%  respiratory variability, suggesting right atrial pressure of 3 mmHg.   Recent Labs: No results found for requested labs within last 8760 hours.  10/23/2021: Chol/HDL Ratio 3.3; Cholesterol, Total 173; HDL 52; LDL Chol Calc (NIH) 99; Triglycerides 125   CrCl cannot be calculated (Patient's most recent lab result is older than the maximum 21 days allowed.).   Wt Readings from Last 3 Encounters:  10/22/21 124 lb 4 oz (56.4 kg)  10/22/20 133 lb 4 oz (60.4 kg)  10/18/19 134 lb (60.8 kg)  Other studies reviewed: Additional studies/records reviewed today include: summarized above  ASSESSMENT AND PLAN:  PPM RRT  Discussed gen change procedure, potential risks benefits She is agreeable o proceed She would like Dr. Lovena Le to do the procedure and follow her She is device dependent  HTN Lower of late, I suspect likely 2/2 to asynchronous pacing I advised her to continue to hold the amlodipine for now, until after her procedure   Disposition: F/u with usual post procedure f/u   Current medicines are reviewed at length with the patient today.  The patient did not have any concerns regarding medicines.  Venetia Night, PA-C 01/22/2022 5:37 AM     [address] [place] [address] [phone] (office)  [phone] (fax)

## 2022-01-23 ENCOUNTER — Ambulatory Visit: Payer: Medicare Other | Admitting: Physician Assistant

## 2022-01-23 ENCOUNTER — Encounter: Payer: Self-pay | Admitting: *Deleted

## 2022-01-23 ENCOUNTER — Other Ambulatory Visit: Payer: Self-pay

## 2022-01-23 ENCOUNTER — Encounter: Payer: Self-pay | Admitting: Physician Assistant

## 2022-01-23 VITALS — Ht 63.5 in

## 2022-01-23 DIAGNOSIS — I10 Essential (primary) hypertension: Secondary | ICD-10-CM | POA: Diagnosis not present

## 2022-01-23 DIAGNOSIS — Z45010 Encounter for checking and testing of cardiac pacemaker pulse generator [battery]: Secondary | ICD-10-CM

## 2022-01-23 DIAGNOSIS — Z01818 Encounter for other preprocedural examination: Secondary | ICD-10-CM | POA: Diagnosis not present

## 2022-01-23 DIAGNOSIS — Z950 Presence of cardiac pacemaker: Secondary | ICD-10-CM | POA: Diagnosis not present

## 2022-01-23 DIAGNOSIS — I442 Atrioventricular block, complete: Secondary | ICD-10-CM | POA: Diagnosis not present

## 2022-01-23 LAB — CUP PACEART INCLINIC DEVICE CHECK
Battery Impedance: 8789 Ohm
Battery Voltage: 2.6 V
Brady Statistic RV Percent Paced: 100 %
Date Time Interrogation Session: 20230126092757
Implantable Lead Implant Date: 20120816
Implantable Lead Implant Date: 20120816
Implantable Lead Location: 753859
Implantable Lead Location: 753860
Implantable Lead Model: 5076
Implantable Lead Model: 5076
Implantable Pulse Generator Implant Date: 20120816
Lead Channel Impedance Value: 481 Ohm
Lead Channel Impedance Value: 67 Ohm
Lead Channel Pacing Threshold Amplitude: 0.75 V
Lead Channel Pacing Threshold Pulse Width: 0.4 ms
Lead Channel Setting Pacing Amplitude: 2.5 V
Lead Channel Setting Pacing Pulse Width: 0.4 ms
Lead Channel Setting Sensing Sensitivity: 2 mV

## 2022-01-23 LAB — CBC
Hematocrit: 40.6 % (ref 34.0–46.6)
Hemoglobin: 13.6 g/dL (ref 11.1–15.9)
MCH: 28 pg (ref 26.6–33.0)
MCHC: 33.5 g/dL (ref 31.5–35.7)
MCV: 84 fL (ref 79–97)
Platelets: 239 10*3/uL (ref 150–450)
RBC: 4.86 x10E6/uL (ref 3.77–5.28)
RDW: 15.1 % (ref 11.7–15.4)
WBC: 9.7 10*3/uL (ref 3.4–10.8)

## 2022-01-23 LAB — BASIC METABOLIC PANEL
BUN/Creatinine Ratio: 12 (ref 12–28)
BUN: 13 mg/dL (ref 8–27)
CO2: 27 mmol/L (ref 20–29)
Calcium: 9.5 mg/dL (ref 8.7–10.3)
Chloride: 101 mmol/L (ref 96–106)
Creatinine, Ser: 1.06 mg/dL — ABNORMAL HIGH (ref 0.57–1.00)
Glucose: 91 mg/dL (ref 70–99)
Potassium: 4 mmol/L (ref 3.5–5.2)
Sodium: 137 mmol/L (ref 134–144)
eGFR: 53 mL/min/{1.73_m2} — ABNORMAL LOW (ref >59)

## 2022-01-23 NOTE — Patient Instructions (Addendum)
Medication Instructions:    CONTINUE TO HOLD YOU AMLODIPINE UNTIL AFTER YOUR GENERATOR CHANGE OUT 01-31-22 WITH DR Lovena Le.  *If you need a refill on your cardiac medications before your next appointment, please call your pharmacy*   Lab Work: BMET AND CBC TODAY     If you have labs (blood work) drawn today and your tests are completely normal, you will receive your results only by: [place] (if you have MyChart) OR A paper copy in the mail If you have any lab test that is abnormal or we need to change your treatment, we will call you to review the results.   Testing/Procedures: SEE LETTER FOR GENERATOR CHANGEOUT  01-31-22 WITH DR Lovena Le    Follow-Up: At [facility], you and your health needs are our priority.  As part of our continuing mission to provide you with exceptional heart care, we have created designated Provider Care Teams.  These Care Teams include your primary Cardiologist (physician) and Advanced Practice Providers (APPs -  Physician Assistants and Nurse Practitioners) who all work together to provide you with the care you need, when you need it.  We recommend signing up for the patient portal called "MyChart".  Sign up information is provided on this After Visit Summary.  MyChart is used to connect with patients for Virtual Visits (Telemedicine).  Patients are able to view lab/test results, encounter notes, upcoming appointments, etc.  Non-urgent messages can be sent to your provider as well.   To learn more about what you can do with MyChart, go to [email or web address].    Your next appointment:   AFTER 01-31-22  10-14 DAYS WITH DEVICE CLINIC WOUND CHECK   3 month(s)  The format for your next appointment:   In Person  Provider:   Cristopher Peru, MD  [place]    Other Instructions

## 2022-01-30 NOTE — Pre-Procedure Instructions (Signed)
Instructed patient on the following items: Arrival time 1130 Nothing to eat or drink after midnight No meds AM of procedure Responsible person to drive you home and stay with you for 24 hrs Wash with special soap night before and morning of procedure  

## 2022-01-31 ENCOUNTER — Other Ambulatory Visit: Payer: Self-pay

## 2022-01-31 ENCOUNTER — Ambulatory Visit (HOSPITAL_COMMUNITY)
Admission: RE | Admit: 2022-01-31 | Discharge: 2022-01-31 | Disposition: A | Discharge status: Home / Self Care | Payer: Medicare Other | Attending: Internal Medicine | Admitting: Internal Medicine

## 2022-01-31 ENCOUNTER — Ambulatory Visit (HOSPITAL_COMMUNITY)
Admission: RE | Disposition: A | Discharge status: Home / Self Care | Payer: Self-pay | Source: Home / Self Care | Attending: Internal Medicine

## 2022-01-31 DIAGNOSIS — I10 Essential (primary) hypertension: Secondary | ICD-10-CM | POA: Diagnosis not present

## 2022-01-31 DIAGNOSIS — E785 Hyperlipidemia, unspecified: Secondary | ICD-10-CM | POA: Insufficient documentation

## 2022-01-31 DIAGNOSIS — Z8616 Personal history of COVID-19: Secondary | ICD-10-CM | POA: Insufficient documentation

## 2022-01-31 DIAGNOSIS — Z45010 Encounter for checking and testing of cardiac pacemaker pulse generator [battery]: Secondary | ICD-10-CM | POA: Diagnosis present

## 2022-01-31 DIAGNOSIS — I442 Atrioventricular block, complete: Secondary | ICD-10-CM | POA: Diagnosis not present

## 2022-01-31 HISTORY — PX: PPM GENERATOR CHANGEOUT: EP1233

## 2022-01-31 SURGERY — PPM GENERATOR CHANGEOUT

## 2022-01-31 MED ORDER — CEFAZOLIN SODIUM-DEXTROSE 2-4 GM/100ML-% IV SOLN
INTRAVENOUS | Status: AC
  Filled 2022-01-31: qty 100

## 2022-01-31 MED ORDER — LIDOCAINE HCL 1 % IJ SOLN
INTRAMUSCULAR | Status: AC
  Filled 2022-01-31: qty 60

## 2022-01-31 MED ORDER — ACETAMINOPHEN 325 MG PO TABS: 325.0000 mg | ORAL_TABLET | ORAL | Status: DC | PRN

## 2022-01-31 MED ORDER — POVIDONE-IODINE 10 % EX SWAB
2.0000 "application " | Freq: Once | CUTANEOUS | Status: AC
  Administered 2022-01-31: 2 via TOPICAL

## 2022-01-31 MED ORDER — SODIUM CHLORIDE 0.9 % IV SOLN
INTRAVENOUS | Status: AC
  Filled 2022-01-31: qty 2

## 2022-01-31 MED ORDER — ONDANSETRON HCL 4 MG/2ML IJ SOLN: 4.0000 mg | Freq: Four times a day (QID) | INTRAMUSCULAR | Status: DC | PRN

## 2022-01-31 MED ORDER — LIDOCAINE HCL (PF) 1 % IJ SOLN
INTRAMUSCULAR | Status: DC | PRN
  Administered 2022-01-31: 60 mL

## 2022-01-31 MED ORDER — SODIUM CHLORIDE 0.9 % IV SOLN
80.0000 mg | INTRAVENOUS | Status: AC
  Administered 2022-01-31: 80 mg

## 2022-01-31 MED ORDER — FENTANYL CITRATE (PF) 100 MCG/2ML IJ SOLN
INTRAMUSCULAR | Status: AC
  Filled 2022-01-31: qty 2

## 2022-01-31 MED ORDER — MIDAZOLAM HCL 5 MG/5ML IJ SOLN
INTRAMUSCULAR | Status: AC
  Filled 2022-01-31: qty 5

## 2022-01-31 MED ORDER — CHLORHEXIDINE GLUCONATE 4 % EX LIQD
4.0000 "application " | Freq: Once | CUTANEOUS | Status: DC
  Filled 2022-01-31: qty 60

## 2022-01-31 MED ORDER — SODIUM CHLORIDE 0.9 % IV SOLN: INTRAVENOUS | Status: DC

## 2022-01-31 MED ORDER — CEFAZOLIN SODIUM-DEXTROSE 2-4 GM/100ML-% IV SOLN
2.0000 g | INTRAVENOUS | Status: AC
  Administered 2022-01-31: 2 g via INTRAVENOUS

## 2022-01-31 SURGICAL SUPPLY — 5 items
CABLE SURGICAL S-101-97-12 (CABLE) ×2 IMPLANT
IPG PACE AZUR XT DR MRI W1DR01 (Pacemaker) IMPLANT
PACE AZURE XT DR MRI W1DR01 (Pacemaker) ×2 IMPLANT
PAD DEFIB RADIO PHYSIO CONN (PAD) ×2 IMPLANT
TRAY PACEMAKER INSERTION (PACKS) ×2 IMPLANT

## 2022-01-31 NOTE — Discharge Instructions (Signed)
Implantable Cardiac Device Battery Change, Care After  This sheet gives you information about how to care for yourself after your procedure. Your health care provider may also give you more specific instructions. If you have problems or questions, contact your health care provider. What can I expect after the procedure? After your procedure, it is common to have: Pain or soreness at the site where the cardiac device was inserted. Swelling at the site where the cardiac device was inserted. You should received an information card for your new device in 4-8 weeks. Follow these instructions at home: Incision care  Keep the incision clean and dry. Do not take baths, swim, or use a hot tub until after your wound check.  Pat the area dry with a clean towel. Do not rub the area. This may cause bleeding. Follow instructions from your health care provider about how to take care of your incision. Make sure you: Leave stitches (sutures), skin glue, or adhesive strips in place. These skin closures may need to stay in place for 2 weeks or longer. If adhesive strip edges start to loosen and curl up, you may trim the loose edges. Do not remove adhesive strips completely unless your health care provider tells you to do that. Check your incision area every day for signs of infection. Check for: More redness, swelling, or pain. More fluid or blood. Warmth. Pus or a bad smell. Activity Do not lift anything that is heavier than 10 lb for 5 days. For the first week, or as long as told by your health care provider: Avoid lifting your affected arm higher than your shoulder. After 1 week, Be gentle when you move your arms over your head. It is okay to raise your arm to comb your hair. Avoid strenuous exercise. Ask your health care provider when it is okay to: Resume your normal activities. Return to work or school. Resume sexual activity. Eating and drinking Eat a heart-healthy diet. This should include plenty  of fresh fruits and vegetables, whole grains, low-fat dairy products, and lean protein like chicken and fish. Limit alcohol intake to no more than 1 drink a day for non-pregnant women and 2 drinks a day for men. One drink equals 12 oz of beer, 5 oz of wine, or 1 oz of hard liquor. Check ingredients and nutrition facts on packaged foods and beverages. Avoid the following types of food: Food that is high in salt (sodium). Food that is high in saturated fat, like full-fat dairy or red meat. Food that is high in trans fat, like fried food. Food and drinks that are high in sugar. Lifestyle Do not use any products that contain nicotine or tobacco, such as cigarettes and e-cigarettes. If you need help quitting, ask your health care provider. Take steps to manage and control your weight. Once cleared, get regular exercise. Aim for 150 minutes of moderate-intensity exercise (such as walking or yoga) or 75 minutes of vigorous exercise (such as running or swimming) each week. Manage other health problems, such as diabetes or high blood pressure. Ask your health care provider how you can manage these conditions. General instructions Do not drive for 24 hours after your procedure if you were given a medicine to help you relax (sedative). Take over-the-counter and prescription medicines only as told by your health care provider. Avoid putting pressure on the area where the cardiac device was placed. If you need an MRI after your cardiac device has been placed, be sure to tell the health  care provider who orders the MRI that you have a cardiac device. Avoid close and prolonged exposure to electrical devices that have strong magnetic fields. These include: Cell phones. Avoid keeping them in a pocket near the cardiac device, and try using the ear opposite the cardiac device. MP3 players. Household appliances, like microwaves. Metal detectors. Electric generators. High-tension wires. Keep all follow-up visits  as directed by your health care provider. This is important. Contact a health care provider if: You have pain at the incision site that is not relieved by over-the-counter or prescription medicines. You have any of these around your incision site or coming from it: More redness, swelling, or pain. Fluid or blood. Warmth to the touch. Pus or a bad smell. You have a fever. You feel brief, occasional palpitations, light-headedness, or any symptoms that you think might be related to your heart. Get help right away if: You experience chest pain that is different from the pain at the cardiac device site. You develop a red streak that extends above or below the incision site. You experience shortness of breath. You have palpitations or an irregular heartbeat. You have light-headedness that does not go away quickly. You faint or have dizzy spells. Your pulse suddenly drops or increases rapidly and does not return to normal. You begin to gain weight and your legs and ankles swell. Summary After your procedure, it is common to have pain, soreness, and some swelling where the cardiac device was inserted. Make sure to keep your incision clean and dry. Follow instructions from your health care provider about how to take care of your incision. Check your incision every day for signs of infection, such as more pain or swelling, pus or a bad smell, warmth, or leaking fluid and blood. Avoid strenuous exercise and lifting your left arm higher than your shoulder for 2 weeks, or as long as told by your health care provider. This information is not intended to replace advice given to you by your health care provider. Make sure you discuss any questions you have with your health care provider.

## 2022-01-31 NOTE — H&P (Signed)
Cardiology Office Note Date:  01/22/2022  Patient ID:  Katrina Hunter, Katrina Hunter 10/03/1941, MRN [id number] PCP:  [name], MD         Cardiologist:  Dr. Rockey Situ Electrophysiologist: Dr. Lovena Le >> Dr. Caryl Comes     Chief Complaint:  device ERI   History of Present Illness: Katrina Hunter is a 81 y.o. female with history of lung Ca s/p left wedge resection, HTN, HLD, CHB w/PPM   She comes in today to be seen for Dr. Caryl Comes, last seen by him April 2020 via tele health to establish care at the [facility] office for pacer management, closer to her.  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 Rockey Situ, saw him last Oct 2022, aorta stable at last CT in 2020, the pt did not want further CTs and would not pursue surgery even if indicated. She was doing wll, planned for an echo to f/u on a murmur Repatha added for lipid management     COVID 01/10/22     TODAY She is doing well, recovered from her COVID infection. She denies any CP, palpitations, cardiac awareness Was by her PMD last week and he suggested with lower BP to hold off on taking her amlodipine for a few days, and resume once improved. She denies any SOB No near syncope or syncope.   Device information MDT dual chamber PPM implanted 08/14/2011         Past Medical History:  Diagnosis Date   Aneurysm ([place])      aortic    AV block, complete (HCC)      PPM placed 07/2011 [facility]   Headache      migraines   Heart murmur     History of prolapse of bladder     HLD (hyperlipidemia)     HTN (hypertension)     Hypothyroidism       no rx   Lung cancer ([place]) dx'd 06/2017   Pacemaker 12   Pulmonary nodule, left 04/22/2017    From order notes           Past Surgical History:  Procedure Laterality Date   BLADDER SURGERY   01/03/2017   LYMPH NODE DISSECTION Left 06/29/2017    Procedure: LYMPH NODE DISSECTION LEFT LUNG;  Surgeon: Melrose Nakayama, MD;  Location: [place];  Service: Thoracic;  Laterality: Left;   PACEMAKER INSERTION   07/2011    [name]   TONSILLECTOMY   age 65   [place] (VATS)/WEDGE RESECTION Left 06/29/2017    Procedure: VIDEO ASSISTED THORACOSCOPY (VATS)/WEDGE RESECTION;  Surgeon: Melrose Nakayama, MD;  Location: [place];  Service: Thoracic;  Laterality: Left;   [place] N/A 12/05/2013    Procedure: VIDEO BRONCHOSCOPY WITH ENDOBRONCHIAL NAVIGATION;  Surgeon: Collene Gobble, MD;  [facility];  Service: Thoracic;  Laterality: N/A;            Current Outpatient Medications  Medication Sig Dispense Refill   amLODipine (NORVASC) 10 MG tablet Take 1 tablet (10 mg total) by mouth daily. 90 tablet 3   cholecalciferol (VITAMIN D) 1000 UNITS tablet Take 2,000 Units by mouth daily.        Cyanocobalamin (B-12) 3000 MCG CAPS Take 3,000 mcg by mouth daily.       Evolocumab (REPATHA SURECLICK) 025 MG/ML  SOAJ Inject 1 pen into the skin every 14 (fourteen) days. 2 mL 11   ezetimibe (ZETIA) 10 MG tablet Take 1 tablet (10 mg total) by mouth daily. 90 tablet 3   levothyroxine (SYNTHROID) 50 MCG tablet Take 50 mcg by mouth daily before breakfast.       potassium chloride (KLOR-CON) 8 MEQ tablet Take 8 mEq by mouth 2 (two) times daily.       pravastatin (PRAVACHOL) 20 MG tablet Take 1 tablet (20 mg total) by mouth at bedtime. 90 tablet 3   triamcinolone (NASACORT) 55 MCG/ACT AERO nasal inhaler Place into the nose.       triamterene-hydrochlorothiazide (MAXZIDE-25) 37.5-25 MG tablet Take 0.5 tablets by mouth daily. 45 tablet 3    No current facility-administered medications for this visit.      Allergies:   Ace inhibitors, Crestor [rosuvastatin calcium], and Sulfonamide derivatives    Social History:  The patient  reports that she has never smoked. She has never used smokeless tobacco. She reports that she does not drink alcohol and does not  use drugs.    Family History:  The patient's family history includes Breast cancer (age of onset: 61) in her mother; Heart disease in her father.   ROS:  Please see the history of present illness.    All other systems are reviewed and otherwise negative.    PHYSICAL EXAM:  VS:  There were no vitals taken for this visit. BMI: There is no height or weight on file to calculate BMI. Well nourished, well developed, in no acute distress HEENT: normocephalic, atraumatic Neck: no JVD, carotid bruits or masses Cardiac:   RRR; no significant murmurs, no rubs, or gallops Lungs:   CTA b/l, no wheezing, rhonchi or rales Abd: soft, nontender MS: no deformity or atrophy Ext: no edema Skin: warm and dry, no rash Neuro:  No gross deficits appreciated Psych: euthymic mood, full affect   PPM site is stable, no tethering or discomfort     EKG:  Done today and reviewed by myself shows  Asynchronous V pacing, 65   Device interrogation done today and reviewed by myself:  RRT reached 12/09/21 No R waves at 40 today though did come in with a single intrinsic beat with LOC Lead measurements are OK   11/18/2021: TTE IMPRESSIONS   1. Left ventricular ejection fraction, by estimation, is 55 to 60%. Left  ventricular ejection fraction by 2D MOD biplane is 60.7 %. The left  ventricle has normal function. The left ventricle has no regional wall  motion abnormalities. There is mild left  ventricular hypertrophy of the basal-septal segment. Left ventricular  diastolic parameters are consistent with Grade I diastolic dysfunction  (impaired relaxation). The average left ventricular global longitudinal  strain is -18.8 %. The global  longitudinal strain is normal.   2. Right ventricular systolic function is normal. The right ventricular  size is normal.   3. The mitral valve is normal in structure. Mild mitral valve  regurgitation.   4. The aortic valve is tricuspid. Aortic valve regurgitation is not   visualized. Aortic valve sclerosis is present, with no evidence of aortic  valve stenosis.   5. Aortic dilatation noted. There is mild dilatation of the aortic root,  measuring 39 mm. There is moderate dilatation of the ascending aorta,  measuring 47 mm.   6. The inferior vena cava is normal in size with greater than 50%  respiratory variability, suggesting right atrial pressure of 3 mmHg.  Recent Labs: No results found for requested labs within last 8760 hours.  10/23/2021: Chol/HDL Ratio 3.3; Cholesterol, Total 173; HDL 52; LDL Chol Calc (NIH) 99; Triglycerides 125    CrCl cannot be calculated (Patient's most recent lab result is older than the maximum 21 days allowed.).       Wt Readings from Last 3 Encounters:  10/22/21 124 lb 4 oz (56.4 kg)  10/22/20 133 lb 4 oz (60.4 kg)  10/18/19 134 lb (60.8 kg)      Other studies reviewed: Additional studies/records reviewed today include: summarized above   ASSESSMENT AND PLAN:   PPM RRT   Discussed gen change procedure, potential risks benefits She is agreeable o proceed She would like Dr. Lovena Le to do the procedure and follow her She is device dependent   HTN [place] of late, I suspect likely 2/2 to asynchronous pacing I advised her to continue to hold the amlodipine for now, until after her procedure     Disposition: F/u with usual post procedure f/u     Current medicines are reviewed at length with the patient today.  The patient did not have any concerns regarding medicines.   Venetia Night, PA-C 01/22/2022 5:37 AM     EP Attending  Patient seen and examined. Agree with the findings above. The patient presents today for PPM gen change due to CHB. I have reviewed the indications/risks/benefits/goals/expectations with the patient and she wishes to proceed.  Carleene Overlie Chrislynn Mosely,MD

## 2022-02-03 ENCOUNTER — Encounter (HOSPITAL_COMMUNITY): Payer: Self-pay | Admitting: Internal Medicine

## 2022-02-04 MED FILL — Lidocaine HCl Local Inj 1%: INTRAMUSCULAR | Qty: 60 | Status: AC

## 2022-02-13 ENCOUNTER — Ambulatory Visit (INDEPENDENT_AMBULATORY_CARE_PROVIDER_SITE_OTHER): Payer: Medicare Other

## 2022-02-13 ENCOUNTER — Other Ambulatory Visit: Payer: Self-pay

## 2022-02-13 DIAGNOSIS — I442 Atrioventricular block, complete: Secondary | ICD-10-CM | POA: Diagnosis not present

## 2022-02-13 LAB — CUP PACEART INCLINIC DEVICE CHECK
Battery Remaining Longevity: 129 mo
Battery Voltage: 3.2 V
Brady Statistic AP VP Percent: 22 %
Brady Statistic AP VS Percent: 0 %
Brady Statistic AS VP Percent: 77.99 %
Brady Statistic AS VS Percent: 0 %
Brady Statistic RA Percent Paced: 21.92 %
Brady Statistic RV Percent Paced: 100 %
Date Time Interrogation Session: 20230216101909
Implantable Lead Implant Date: 20120816
Implantable Lead Implant Date: 20120816
Implantable Lead Location: 753859
Implantable Lead Location: 753860
Implantable Lead Model: 5076
Implantable Lead Model: 5076
Implantable Pulse Generator Implant Date: 20230205
Lead Channel Impedance Value: 342 Ohm
Lead Channel Impedance Value: 399 Ohm
Lead Channel Impedance Value: 418 Ohm
Lead Channel Impedance Value: 608 Ohm
Lead Channel Pacing Threshold Amplitude: 0.5 V
Lead Channel Pacing Threshold Amplitude: 0.75 V
Lead Channel Pacing Threshold Pulse Width: 0.4 ms
Lead Channel Pacing Threshold Pulse Width: 0.4 ms
Lead Channel Sensing Intrinsic Amplitude: 3 mV
Lead Channel Setting Pacing Amplitude: 2 V
Lead Channel Setting Pacing Amplitude: 3.5 V
Lead Channel Setting Pacing Pulse Width: 0.4 ms
Lead Channel Setting Sensing Sensitivity: 0.9 mV

## 2022-02-13 NOTE — Patient Instructions (Signed)

## 2022-02-13 NOTE — Progress Notes (Signed)
Wound check appointment. Steri-strips removed. Wound without redness or edema. Incision edges approximated, wound well healed. Normal device function. Thresholds, P wave sensing, and impedances consistent with implant measurements. Device programmed at appropriate safety margin for chronic leads. Histogram distribution appropriate for patient and level of activity. No mode switches or high ventricular rates noted. Patient educated about wound care, arm mobility, lifting restrictions. Patient is enrolled in remote follow-up, next scheduled check 05/05/22.  ROV with Dr. Lovena Le on 05/23/22.

## 2022-03-27 ENCOUNTER — Telehealth: Payer: Self-pay | Admitting: *Deleted

## 2022-03-27 NOTE — Telephone Encounter (Signed)
Request received via covermymeds. ?Contacted pt to confirm if she was taking medication because medication wasn't listed on her current medication list. ?Pt refused refill/Prior auth request she isn't taking medication and never got it filled due to high cost. ?

## 2022-05-05 ENCOUNTER — Ambulatory Visit (INDEPENDENT_AMBULATORY_CARE_PROVIDER_SITE_OTHER): Payer: Medicare Other

## 2022-05-05 DIAGNOSIS — I442 Atrioventricular block, complete: Secondary | ICD-10-CM

## 2022-05-05 LAB — CUP PACEART REMOTE DEVICE CHECK
Battery Remaining Longevity: 139 mo
Battery Voltage: 3.17 V
Brady Statistic AP VP Percent: 39.69 %
Brady Statistic AP VS Percent: 0 %
Brady Statistic AS VP Percent: 60.3 %
Brady Statistic AS VS Percent: 0.01 %
Brady Statistic RA Percent Paced: 39.59 %
Brady Statistic RV Percent Paced: 99.99 %
Date Time Interrogation Session: 20230508054330
Implantable Lead Implant Date: 20120816
Implantable Lead Implant Date: 20120816
Implantable Lead Location: 753859
Implantable Lead Location: 753860
Implantable Lead Model: 5076
Implantable Lead Model: 5076
Implantable Pulse Generator Implant Date: 20230205
Lead Channel Impedance Value: 304 Ohm
Lead Channel Impedance Value: 361 Ohm
Lead Channel Impedance Value: 399 Ohm
Lead Channel Impedance Value: 570 Ohm
Lead Channel Pacing Threshold Amplitude: 0.625 V
Lead Channel Pacing Threshold Amplitude: 1 V
Lead Channel Pacing Threshold Pulse Width: 0.4 ms
Lead Channel Pacing Threshold Pulse Width: 0.4 ms
Lead Channel Sensing Intrinsic Amplitude: 2.875 mV
Lead Channel Sensing Intrinsic Amplitude: 2.875 mV
Lead Channel Setting Pacing Amplitude: 1.5 V
Lead Channel Setting Pacing Amplitude: 2 V
Lead Channel Setting Pacing Pulse Width: 0.4 ms
Lead Channel Setting Sensing Sensitivity: 0.9 mV

## 2022-05-21 NOTE — Progress Notes (Signed)
Remote pacemaker transmission.   

## 2022-05-23 ENCOUNTER — Encounter: Payer: Medicare Other | Admitting: Internal Medicine

## 2022-05-26 NOTE — Progress Notes (Unsigned)
Cardiology Office Note Date:  05/27/2022  Patient ID:  Katrina Hunter, Katrina Hunter 1941/05/19, MRN [id number] PCP:  [name], MD  Cardiologist:  Dr. Rockey Situ Electrophysiologist: Dr. Lovena Le     Chief Complaint:   91 day  History of Present Illness: Katrina Hunter is a 81 y.o. female with history of lung Ca s/p left wedge resection, HTN, HLD, CHB w/PPM   She comes in today to be seen for Dr. Caryl Comes, last seen by him April 2020 via tele health to establish care at the [facility] office for pacer management, closer to her.  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 Rockey Situ, saw him last Oct 2022, aorta stable at last CT in 2020, the pt did not want further CTs and would not pursue surgery even if indicated. She was doing wll, planned for an echo to f/u on a murmur Repatha added for lipid management  COVID 01/10/22   I saw her Jan 2023 She is doing well, recovered from her COVID infection. She denies any CP, palpitations, cardiac awareness Was by her PMD last week and he suggested with lower BP to hold off on taking her amlodipine for a few days, and resume once improved. She denies any SOB No near syncope or syncope. Device was ERI BP a little low, suspect 2/2 VVI pacing and her amlodipine held until after her gen change   TODAY She is accompanied by her husband. Doing very well Much better since her gen change  No CP, palpitations or SOB No cardiac awareness No near syncope or syncope.   Device information MDT dual chamber PPM implanted 08/14/2011 > gem change 01/31/22 She is dependent   Past Medical History:  Diagnosis Date   Aneurysm ([place])    aortic    AV block, complete ([place])    PPM placed 07/2011 [facility]   Headache    migraines   Heart murmur    History of prolapse of bladder    HLD (hyperlipidemia)    HTN (hypertension)    Hypothyroidism     no rx   Lung cancer ([place]) dx'd  06/2017   Pacemaker 12   Pulmonary nodule, left 04/22/2017   From order notes    Past Surgical History:  Procedure Laterality Date   BLADDER SURGERY  01/03/2017   LYMPH NODE DISSECTION Left 06/29/2017   Procedure: LYMPH NODE DISSECTION LEFT LUNG;  Surgeon: Melrose Nakayama, MD;  Location: [place];  Service: Thoracic;  Laterality: Left;   PACEMAKER INSERTION  07/2011   [name]   PPM GENERATOR CHANGEOUT N/A 01/31/2022   Procedure: PPM GENERATOR CHANGEOUT;  Surgeon: Evans Lance, MD;  Location: [place] CV LAB;  Service: Cardiovascular;  Laterality: N/A;   TONSILLECTOMY  age 28   TUBAL LIGATION     VIDEO ASSISTED THORACOSCOPY (VATS)/WEDGE RESECTION Left 06/29/2017   Procedure: VIDEO ASSISTED THORACOSCOPY (VATS)/WEDGE RESECTION;  Surgeon: Melrose Nakayama, MD;  Location: [place];  Service: Thoracic;  Laterality: Left;   VIDEO BRONCHOSCOPY WITH ENDOBRONCHIAL NAVIGATION N/A 12/05/2013   Procedure: VIDEO BRONCHOSCOPY WITH ENDOBRONCHIAL NAVIGATION;  Surgeon: Collene Gobble, MD;  [facility];  Service: Thoracic;  Laterality: N/A;    Current Outpatient Medications  Medication Sig Dispense Refill   amLODipine (NORVASC) 10 MG tablet Take 1 tablet (10 mg total) by mouth daily. 90 tablet 3   cetirizine (ZYRTEC) 10 MG tablet Take 10  mg by mouth at bedtime.     cholecalciferol (VITAMIN D) 1000 UNITS tablet Take 2,000 Units by mouth in the morning.     Cyanocobalamin (B-12) 3000 MCG CAPS Take 3,000 mcg by mouth daily.     ezetimibe (ZETIA) 10 MG tablet Take 1 tablet (10 mg total) by mouth daily. (Patient taking differently: Take 10 mg by mouth every evening.) 90 tablet 3   levothyroxine (SYNTHROID) 50 MCG tablet Take 50 mcg by mouth daily before breakfast.     potassium chloride (KLOR-CON) 8 MEQ tablet Take 16 mEq by mouth in the morning.     pravastatin (PRAVACHOL) 20 MG tablet Take 1 tablet (20 mg total) by mouth at bedtime. 90 tablet 3   triamcinolone (NASACORT) 55 MCG/ACT AERO nasal inhaler  Place 1-2 sprays into the nose daily as needed (allergies.).     triamterene-hydrochlorothiazide (MAXZIDE-25) 37.5-25 MG tablet Take 0.5 tablets by mouth daily. 45 tablet 3   vitamin B-12 (CYANOCOBALAMIN) 1000 MCG tablet Take 2,000 mcg by mouth in the morning.     No current facility-administered medications for this visit.    Allergies:   Ace inhibitors, Crestor [rosuvastatin calcium], and Sulfonamide derivatives   Social History:  The patient  reports that she has never smoked. She has never used smokeless tobacco. She reports that she does not drink alcohol and does not use drugs.   Family History:  The patient's family history includes Breast cancer (age of onset: 16) in her mother; Heart disease in her father.  ROS:  Please see the history of present illness.    All other systems are reviewed and otherwise negative.   PHYSICAL EXAM:  VS:  BP 130/70   Pulse 67   Ht 5' 3.5" (1.613 m)   Wt 124 lb (56.2 kg)   SpO2 97%   BMI 21.62 kg/m  BMI: Body mass index is 21.62 kg/m. Well nourished, well developed, in no acute distress HEENT: normocephalic, atraumatic Neck: no JVD, carotid bruits or masses Cardiac:    RRR; 1-2/6 SM, no rubs, or gallops Lungs:    CTA b/l, no wheezing, rhonchi or rales Abd: soft, nontender MS: no deformity or atrophy Ext:  no edema Skin: warm and dry, no rash Neuro:  No gross deficits appreciated Psych: euthymic mood, full affect  PPM site is stable, no tethering or discomfort, well healed   EKG:  not done today  Device interrogation done today and reviewed by myself:  Battery and lead measurements are good No arrhythmias   11/18/2021: TTE IMPRESSIONS   1. Left ventricular ejection fraction, by estimation, is 55 to 60%. Left  ventricular ejection fraction by 2D MOD biplane is 60.7 %. The left  ventricle has normal function. The left ventricle has no regional wall  motion abnormalities. There is mild left  ventricular hypertrophy of the  basal-septal segment. Left ventricular  diastolic parameters are consistent with Grade I diastolic dysfunction  (impaired relaxation). The average left ventricular global longitudinal  strain is -18.8 %. The global  longitudinal strain is normal.   2. Right ventricular systolic function is normal. The right ventricular  size is normal.   3. The mitral valve is normal in structure. Mild mitral valve  regurgitation.   4. The aortic valve is tricuspid. Aortic valve regurgitation is not  visualized. Aortic valve sclerosis is present, with no evidence of aortic  valve stenosis.   5. Aortic dilatation noted. There is mild dilatation of the aortic root,  measuring 39 mm. There  is moderate dilatation of the ascending aorta,  measuring 47 mm.   6. The inferior vena cava is normal in size with greater than 50%  respiratory variability, suggesting right atrial pressure of 3 mmHg.   Recent Labs: 01/23/2022: BUN 13; Creatinine, Ser 1.06; Hemoglobin 13.6; Platelets 239; Potassium 4.0; Sodium 137  10/23/2021: Chol/HDL Ratio 3.3; Cholesterol, Total 173; HDL 52; LDL Chol Calc (NIH) 99; Triglycerides 125   CrCl cannot be calculated (Patient's most recent lab result is older than the maximum 21 days allowed.).   Wt Readings from Last 3 Encounters:  05/27/22 124 lb (56.2 kg)  01/31/22 120 lb (54.4 kg)  10/22/21 124 lb 4 oz (56.4 kg)     Other studies reviewed: Additional studies/records reviewed today include: summarized above  ASSESSMENT AND PLAN:  PPM Intact function Well healed No programming changes made  HTN Looks good No changes   Disposition:remotes as usual, in clinic with EP in a year, sooner if needed    Current medicines are reviewed at length with the patient today.  The patient did not have any concerns regarding medicines.  Venetia Night, PA-C 05/27/2022 9:54 AM     [address] [place] [phone] (office)   [phone] (fax)

## 2022-05-27 ENCOUNTER — Encounter: Payer: Self-pay | Admitting: Physician Assistant

## 2022-05-27 ENCOUNTER — Ambulatory Visit (INDEPENDENT_AMBULATORY_CARE_PROVIDER_SITE_OTHER): Payer: Medicare Other | Admitting: Physician Assistant

## 2022-05-27 VITALS — BP 130/70 | HR 67 | Ht 63.5 in | Wt 124.0 lb

## 2022-05-27 DIAGNOSIS — I10 Essential (primary) hypertension: Secondary | ICD-10-CM

## 2022-05-27 DIAGNOSIS — Z950 Presence of cardiac pacemaker: Secondary | ICD-10-CM

## 2022-05-27 DIAGNOSIS — I442 Atrioventricular block, complete: Secondary | ICD-10-CM

## 2022-05-27 LAB — CUP PACEART INCLINIC DEVICE CHECK
Battery Remaining Longevity: 132 mo
Battery Voltage: 3.16 V
Brady Statistic AP VP Percent: 38.8 %
Brady Statistic AP VS Percent: 0 %
Brady Statistic AS VP Percent: 61.19 %
Brady Statistic AS VS Percent: 0 %
Brady Statistic RA Percent Paced: 38.71 %
Brady Statistic RV Percent Paced: 99.99 %
Date Time Interrogation Session: 20230530133358
Implantable Lead Implant Date: 20120816
Implantable Lead Implant Date: 20120816
Implantable Lead Location: 753859
Implantable Lead Location: 753860
Implantable Lead Model: 5076
Implantable Lead Model: 5076
Implantable Pulse Generator Implant Date: 20230205
Lead Channel Impedance Value: 342 Ohm
Lead Channel Impedance Value: 418 Ohm
Lead Channel Impedance Value: 456 Ohm
Lead Channel Impedance Value: 665 Ohm
Lead Channel Pacing Threshold Amplitude: 0.625 V
Lead Channel Pacing Threshold Amplitude: 1 V
Lead Channel Pacing Threshold Pulse Width: 0.4 ms
Lead Channel Pacing Threshold Pulse Width: 0.4 ms
Lead Channel Sensing Intrinsic Amplitude: 2.625 mV
Lead Channel Sensing Intrinsic Amplitude: 2.875 mV
Lead Channel Setting Pacing Amplitude: 1.5 V
Lead Channel Setting Pacing Amplitude: 2.25 V
Lead Channel Setting Pacing Pulse Width: 0.4 ms
Lead Channel Setting Sensing Sensitivity: 0.9 mV

## 2022-05-27 NOTE — Patient Instructions (Signed)
Medication Instructions:    Your physician recommends that you continue on your current medications as directed. Please refer to the Current Medication list given to you today.  *If you need a refill on your cardiac medications before your next appointment, please call your pharmacy*   Lab Work: [place]    If you have labs (blood work) drawn today and your tests are completely normal, you will receive your results only by: [place] (if you have MyChart) OR A paper copy in the mail If you have any lab test that is abnormal or we need to change your treatment, we will call you to review the results.   Testing/Procedures: NONE ORDERED  TODAY   Follow-Up: At [facility], you and your health needs are our priority.  As part of our continuing mission to provide you with exceptional heart care, we have created designated Provider Care Teams.  These Care Teams include your primary Cardiologist (physician) and Advanced Practice Providers (APPs -  Physician Assistants and Nurse Practitioners) who all work together to provide you with the care you need, when you need it.  We recommend signing up for the patient portal called "MyChart".  Sign up information is provided on this After Visit Summary.  MyChart is used to connect with patients for Virtual Visits (Telemedicine).  Patients are able to view lab/test results, encounter notes, upcoming appointments, etc.  Non-urgent messages can be sent to your provider as well.   To learn more about what you can do with MyChart, go to [email or web address].    Your next appointment:   1 year(s)  The format for your next appointment:   In Person  Provider:   You may see Dr.Taylor  or one of the following Advanced Practice Providers on your designated Care Team:   Tommye Standard, [place] Legrand Como "Jonni Sanger" [place] Cater, PA-C{      Other Instructions   Important Information About Sugar

## 2022-05-28 ENCOUNTER — Other Ambulatory Visit: Payer: Self-pay | Admitting: Internal Medicine

## 2022-05-28 DIAGNOSIS — Z1231 Encounter for screening mammogram for malignant neoplasm of breast: Secondary | ICD-10-CM

## 2022-07-14 ENCOUNTER — Ambulatory Visit
Admission: RE | Admit: 2022-07-14 | Discharge: 2022-07-14 | Disposition: A | Discharge status: Home / Self Care | Payer: Medicare Other | Source: Ambulatory Visit | Attending: Internal Medicine | Admitting: Internal Medicine

## 2022-07-14 DIAGNOSIS — Z1231 Encounter for screening mammogram for malignant neoplasm of breast: Secondary | ICD-10-CM | POA: Insufficient documentation

## 2022-08-04 ENCOUNTER — Ambulatory Visit (INDEPENDENT_AMBULATORY_CARE_PROVIDER_SITE_OTHER): Payer: Medicare Other

## 2022-08-04 DIAGNOSIS — I442 Atrioventricular block, complete: Secondary | ICD-10-CM | POA: Diagnosis not present

## 2022-08-05 LAB — CUP PACEART REMOTE DEVICE CHECK
Battery Remaining Longevity: 136 mo
Battery Voltage: 3.12 V
Brady Statistic AP VP Percent: 52 %
Brady Statistic AP VS Percent: 0 %
Brady Statistic AS VP Percent: 47.98 %
Brady Statistic AS VS Percent: 0.01 %
Brady Statistic RA Percent Paced: 51.92 %
Brady Statistic RV Percent Paced: 99.98 %
Date Time Interrogation Session: 20230806224903
Implantable Lead Implant Date: 20120816
Implantable Lead Implant Date: 20120816
Implantable Lead Location: 753859
Implantable Lead Location: 753860
Implantable Lead Model: 5076
Implantable Lead Model: 5076
Implantable Pulse Generator Implant Date: 20230205
Lead Channel Impedance Value: 323 Ohm
Lead Channel Impedance Value: 380 Ohm
Lead Channel Impedance Value: 437 Ohm
Lead Channel Impedance Value: 570 Ohm
Lead Channel Pacing Threshold Amplitude: 0.625 V
Lead Channel Pacing Threshold Amplitude: 1 V
Lead Channel Pacing Threshold Pulse Width: 0.4 ms
Lead Channel Pacing Threshold Pulse Width: 0.4 ms
Lead Channel Sensing Intrinsic Amplitude: 2.125 mV
Lead Channel Sensing Intrinsic Amplitude: 2.125 mV
Lead Channel Setting Pacing Amplitude: 1.5 V
Lead Channel Setting Pacing Amplitude: 2 V
Lead Channel Setting Pacing Pulse Width: 0.4 ms
Lead Channel Setting Sensing Sensitivity: 0.9 mV

## 2022-09-03 NOTE — Progress Notes (Signed)
Remote pacemaker transmission.   

## 2022-11-03 ENCOUNTER — Ambulatory Visit (INDEPENDENT_AMBULATORY_CARE_PROVIDER_SITE_OTHER): Payer: Medicare Other

## 2022-11-03 DIAGNOSIS — I442 Atrioventricular block, complete: Secondary | ICD-10-CM

## 2022-11-04 LAB — CUP PACEART REMOTE DEVICE CHECK
Battery Remaining Longevity: 123 mo
Battery Voltage: 3.06 V
Brady Statistic AP VP Percent: 54.33 %
Brady Statistic AP VS Percent: 0 %
Brady Statistic AS VP Percent: 45.66 %
Brady Statistic AS VS Percent: 0.01 %
Brady Statistic RA Percent Paced: 54.24 %
Brady Statistic RV Percent Paced: 99.99 %
Date Time Interrogation Session: 20231106001424
Implantable Lead Connection Status: 753985
Implantable Lead Connection Status: 753985
Implantable Lead Implant Date: 20120816
Implantable Lead Implant Date: 20120816
Implantable Lead Location: 753859
Implantable Lead Location: 753860
Implantable Lead Model: 5076
Implantable Lead Model: 5076
Implantable Pulse Generator Implant Date: 20230205
Lead Channel Impedance Value: 323 Ohm
Lead Channel Impedance Value: 380 Ohm
Lead Channel Impedance Value: 418 Ohm
Lead Channel Impedance Value: 570 Ohm
Lead Channel Pacing Threshold Amplitude: 0.625 V
Lead Channel Pacing Threshold Amplitude: 1.125 V
Lead Channel Pacing Threshold Pulse Width: 0.4 ms
Lead Channel Pacing Threshold Pulse Width: 0.4 ms
Lead Channel Sensing Intrinsic Amplitude: 3.875 mV
Lead Channel Sensing Intrinsic Amplitude: 3.875 mV
Lead Channel Setting Pacing Amplitude: 1.5 V
Lead Channel Setting Pacing Amplitude: 2.25 V
Lead Channel Setting Pacing Pulse Width: 0.4 ms
Lead Channel Setting Sensing Sensitivity: 0.9 mV
Zone Setting Status: 755011
Zone Setting Status: 755011

## 2022-11-18 ENCOUNTER — Ambulatory Visit: Payer: Medicare Other | Attending: Cardiovascular Disease | Admitting: Cardiovascular Disease

## 2022-11-18 ENCOUNTER — Encounter: Payer: Self-pay | Admitting: Cardiovascular Disease

## 2022-11-18 VITALS — BP 120/60 | HR 84 | Ht 63.0 in | Wt 125.4 lb

## 2022-11-18 DIAGNOSIS — I442 Atrioventricular block, complete: Secondary | ICD-10-CM | POA: Diagnosis not present

## 2022-11-18 MED ORDER — EZETIMIBE 10 MG PO TABS: 10.0000 mg | ORAL_TABLET | Freq: Every day | ORAL | 3 refills | Status: AC

## 2022-11-18 MED ORDER — TRIAMTERENE-HCTZ 37.5-25 MG PO TABS: 0.5000 | ORAL_TABLET | Freq: Every day | ORAL | 3 refills | Status: AC

## 2022-11-18 MED ORDER — AMLODIPINE BESYLATE 10 MG PO TABS: 10.0000 mg | ORAL_TABLET | Freq: Every day | ORAL | 3 refills | Status: AC

## 2022-11-18 MED ORDER — PRAVASTATIN SODIUM 20 MG PO TABS: 20.0000 mg | ORAL_TABLET | Freq: Every day | ORAL | 3 refills | Status: AC

## 2022-11-18 NOTE — Progress Notes (Signed)
Date:  11/18/2022   ID:  Katrina Hunter, DOB January 01, 1941, MRN [id number]  Patient Location:  [place] [address]   Provider location:   [facility], [place] office  PCP:  [name], MD  Cardiologist:  Arvid Right [facility]  Chief Complaint  Patient presents with   12 month follow up     "Doing well." Medications reviewed by the patient verbally.     History of Present Illness:    Katrina Hunter is a 81 y.o. female w/ past medical history of Complete heart block, pacer, followed by Dr. Lovena Le 2012 Hypertension,  hyperlipidemia Aneurysm of ascending aorta stable 4.8 cm on CT 07/2018 Aortic athero moderate ,  on CT 2019 Lung cancer adenocarcinoma surgery July 2018 Wedge resection presenting for f/u ascending aorta aneurysm, complete heart block and pacemaker  Last seen in clinic by myself October 2022  Prior imaging reviewed Echocardiogram November 2022 Aorta size estimated 4.7 cm Aorta size relatively unchanged since 2016  New battery in 2/23, Dr. Lovena Le  Reports having side effects on Crestor, myalgias Changed to pravastatin 20, continues on Zetia 10 Total cholesterol 160 LDL 80s  Reaction to benazepril, swelling in lipds ACE stopped Changed to amlodipine alone 10 mg daily, blood pressure stable Denies leg swelling Blood pressures stable  EKG personally reviewed by myself on todays visit Paced rhythm rate 84 bpm  Other past medical history reviewed CT 10/2019,  ascending thoracic aorta :  stable diameter and by CTA measures approximately 4.4-4.5 cm in greatest diameter.  Aortic atherosclerosis arch and descending Previously estimated to be 4.8 cm in 2019  Lab work reviewed Total chol 176, LDL 98 Prior total chol 269 in 02/2015 Potassium 3.7 TSH 2.2   Prior CV studies:   The following studies were reviewed today:  CT chest  07/2018 Stable 4.8 cm ascending thoracic aortic aneurysm. Ascending thoracic aortic  aneurysm.  CT scan 2012: aorta dilation was 4.4 cm CT scan 2013: Aorta dilation 4.8 cm CT scan: 2014: 4.7 cm CT scan 11/2015: 5.1 x 4.7 cm CT scan 12/2016: 5.1 cm CT scan 01/2018 aorta dilation 4.7   Past Medical History:  Diagnosis Date   Aneurysm ([place])    aortic    AV block, complete ([facility])    PPM placed 07/2011 [facility]   Headache    migraines   Heart murmur    History of prolapse of bladder    HLD (hyperlipidemia)    HTN (hypertension)    Hypothyroidism     no rx   Lung cancer ([place]) dx'd 06/2017   Pacemaker 12   Pulmonary nodule, left 04/22/2017   From order notes   Past Surgical History:  Procedure Laterality Date   BLADDER SURGERY  01/03/2017   LYMPH NODE DISSECTION Left 06/29/2017   Procedure: LYMPH NODE DISSECTION LEFT LUNG;  Surgeon: Melrose Nakayama, MD;  Location: [place];  Service: Thoracic;  Laterality: Left;   PACEMAKER INSERTION  07/2011   [name]   PPM GENERATOR CHANGEOUT N/A 01/31/2022   Procedure: PPM GENERATOR CHANGEOUT;  Surgeon: Evans Lance, MD;  Location: [place] CV LAB;  Service: Cardiovascular;  Laterality: N/A;   TONSILLECTOMY  age 38   TUBAL LIGATION     VIDEO ASSISTED THORACOSCOPY (VATS)/WEDGE RESECTION Left 06/29/2017   Procedure: VIDEO ASSISTED THORACOSCOPY (VATS)/WEDGE RESECTION;  Surgeon: Melrose Nakayama, MD;  Location: [facility] OR;  Service: Thoracic;  Laterality: Left;   VIDEO BRONCHOSCOPY WITH ENDOBRONCHIAL NAVIGATION N/A  12/05/2013   Procedure: VIDEO BRONCHOSCOPY WITH ENDOBRONCHIAL NAVIGATION;  Surgeon: Collene Gobble, MD;  [facility];  Service: Thoracic;  Laterality: N/A;     Current Meds  Medication Sig   amLODipine (NORVASC) 10 MG tablet Take 1 tablet (10 mg total) by mouth daily.   cetirizine (ZYRTEC) 10 MG tablet Take 10 mg by mouth at bedtime.   cholecalciferol (VITAMIN D) 1000 UNITS tablet Take 2,000 Units by mouth in the morning.   Cyanocobalamin (B-12) 3000 MCG CAPS Take 3,000 mcg by mouth daily.   ezetimibe (ZETIA) 10 MG  tablet Take 1 tablet (10 mg total) by mouth daily. (Patient taking differently: Take 10 mg by mouth every evening.)   levothyroxine (SYNTHROID) 50 MCG tablet Take 50 mcg by mouth daily before breakfast.   potassium chloride (KLOR-CON) 8 MEQ tablet Take 16 mEq by mouth in the morning.   pravastatin (PRAVACHOL) 20 MG tablet Take 1 tablet (20 mg total) by mouth at bedtime.   triamcinolone (NASACORT) 55 MCG/ACT AERO nasal inhaler Place 1-2 sprays into the nose daily as needed (allergies.).   triamterene-hydrochlorothiazide (MAXZIDE-25) 37.5-25 MG tablet Take 0.5 tablets by mouth daily.   vitamin B-12 (CYANOCOBALAMIN) 1000 MCG tablet Take 2,000 mcg by mouth in the morning.     Allergies:   Ace inhibitors, Crestor [rosuvastatin calcium], and Sulfonamide derivatives   Social History   Tobacco Use   Smoking status: Never   Smokeless tobacco: Never  Vaping Use   Vaping Use: Never used  Substance Use Topics   Alcohol use: No   Drug use: No     Family Hx: The patient's family history includes Breast cancer (age of onset: 64) in her mother; Heart disease in her father.  ROS:   Please see the history of present illness.    Review of Systems  Constitutional: Negative.   HENT: Negative.    Respiratory: Negative.    Cardiovascular: Negative.   Gastrointestinal: Negative.   Musculoskeletal: Negative.   Neurological: Negative.   Psychiatric/Behavioral: Negative.    All other systems reviewed and are negative.    Labs/Other Tests and Data Reviewed:    Recent Labs: 01/23/2022: BUN 13; Creatinine, Ser 1.06; Hemoglobin 13.6; Platelets 239; Potassium 4.0; Sodium 137   Recent Lipid Panel Lab Results  Component Value Date/Time   CHOL 173 10/23/2021 08:28 AM   TRIG 125 10/23/2021 08:28 AM   HDL 52 10/23/2021 08:28 AM   CHOLHDL 3.3 10/23/2021 08:28 AM   LDLCALC 99 10/23/2021 08:28 AM    Wt Readings from Last 3 Encounters:  11/18/22 125 lb 6 oz (56.9 kg)  05/27/22 124 lb (56.2 kg)   01/31/22 120 lb (54.4 kg)     Exam:    BP 120/60 (BP Location: Left Arm, Patient Position: Sitting, Cuff Size: Normal)   Pulse 84   Ht 5\' 3"  (1.6 m)   Wt 125 lb 6 oz (56.9 kg)   SpO2 98%   BMI 22.21 kg/m  Constitutional:  oriented to person, place, and time. No distress.  HENT:  Head: Grossly normal Eyes:  no discharge. No scleral icterus.  Neck: No JVD, no carotid bruits  Cardiovascular: Regular rate and rhythm, no murmurs appreciated Pulmonary/Chest: Clear to auscultation bilaterally, no wheezes or rails Abdominal: Soft.  no distension.  no tenderness.  Musculoskeletal: Normal range of motion Neurological:  normal muscle tone. Coordination normal. No atrophy Skin: Skin warm and dry Psychiatric: normal affect, pleasant   ASSESSMENT & PLAN:    Thoracic ascending  aortic aneurysm ([place]) Stable for the past 10 years She would like to delay echo or CT at this time, will consider repeat imaging early 2024 She will call us when she is ready  AV block, complete ([place]) -  Cardiac pacer followed by Dr. Lovena Le  Mixed hyperlipidemia Ldl above goal,  Was unable to get Repatha through insurance Would like to continue pravastatin and Zetia Could consider Praluent in the future if numbers trend higher  Pacemaker Followed by EP  new battery February 2023  Adenocarcinoma of left lung, stage 1 ([place]) non-smoker, Had wedge resection Followed with CT, none in 2 years  Complete heart block ([place]) - Followed by Dr. Lovena Le Denies any new symptoms  Aortic atherosclerosis She has moderate diffuse aortic atherosclerosis on CT scan On statin and zetia,  Goal LDL <70   Total encounter time more than 30 minutes  Greater than 50% was spent in counseling and coordination of care with the patient   Signed, Ida Rogue, MD  11/18/2022 3:18 PM    [place] [address], [place], [place] [address]

## 2022-11-18 NOTE — Patient Instructions (Signed)
Medication Instructions:  No changes  If you need a refill on your cardiac medications before your next appointment, please call your pharmacy.   Lab work: No new labs needed  Testing/Procedures: No new testing needed  Follow-Up: At [facility], you and your health needs are our priority.  As part of our continuing mission to provide you with exceptional heart care, we have created designated Provider Care Teams.  These Care Teams include your primary Cardiologist (physician) and Advanced Practice Providers (APPs -  Physician Assistants and Nurse Practitioners) who all work together to provide you with the care you need, when you need it.  You will need a follow up appointment in 12 months  Providers on your designated Care Team:   Christopher Berge, NP Ryan Dunn, PA-C Cadence Furth, PA-C  COVID-19 Vaccine Information can be found at: [email or web address] For questions related to vaccine distribution or appointments, please email [email or web address] or call [phone].   

## 2022-12-04 NOTE — Progress Notes (Signed)
Remote pacemaker transmission.   

## 2023-02-02 ENCOUNTER — Ambulatory Visit: Payer: Medicare Other

## 2023-02-02 DIAGNOSIS — I442 Atrioventricular block, complete: Secondary | ICD-10-CM | POA: Diagnosis not present

## 2023-02-02 LAB — CUP PACEART REMOTE DEVICE CHECK
Battery Remaining Longevity: 130 mo
Battery Voltage: 3.04 V
Brady Statistic AP VP Percent: 43.81 %
Brady Statistic AP VS Percent: 0 %
Brady Statistic AS VP Percent: 56.18 %
Brady Statistic AS VS Percent: 0 %
Brady Statistic RA Percent Paced: 43.72 %
Brady Statistic RV Percent Paced: 99.99 %
Date Time Interrogation Session: 20240204211607
Implantable Lead Connection Status: 753985
Implantable Lead Connection Status: 753985
Implantable Lead Implant Date: 20120816
Implantable Lead Implant Date: 20120816
Implantable Lead Location: 753859
Implantable Lead Location: 753860
Implantable Lead Model: 5076
Implantable Lead Model: 5076
Implantable Pulse Generator Implant Date: 20230205
Lead Channel Impedance Value: 304 Ohm
Lead Channel Impedance Value: 380 Ohm
Lead Channel Impedance Value: 418 Ohm
Lead Channel Impedance Value: 570 Ohm
Lead Channel Pacing Threshold Amplitude: 0.625 V
Lead Channel Pacing Threshold Amplitude: 1 V
Lead Channel Pacing Threshold Pulse Width: 0.4 ms
Lead Channel Pacing Threshold Pulse Width: 0.4 ms
Lead Channel Sensing Intrinsic Amplitude: 2.625 mV
Lead Channel Sensing Intrinsic Amplitude: 2.625 mV
Lead Channel Setting Pacing Amplitude: 1.5 V
Lead Channel Setting Pacing Amplitude: 2 V
Lead Channel Setting Pacing Pulse Width: 0.4 ms
Lead Channel Setting Sensing Sensitivity: 0.9 mV
Zone Setting Status: 755011
Zone Setting Status: 755011

## 2023-03-18 NOTE — Progress Notes (Signed)
Remote pacemaker transmission.   

## 2023-05-04 ENCOUNTER — Ambulatory Visit (INDEPENDENT_AMBULATORY_CARE_PROVIDER_SITE_OTHER): Payer: Medicare Other

## 2023-05-04 DIAGNOSIS — I442 Atrioventricular block, complete: Secondary | ICD-10-CM | POA: Diagnosis not present

## 2023-05-04 LAB — CUP PACEART REMOTE DEVICE CHECK
Battery Remaining Longevity: 117 mo
Battery Voltage: 3.03 V
Brady Statistic AP VP Percent: 43.84 %
Brady Statistic AP VS Percent: 0 %
Brady Statistic AS VP Percent: 56.15 %
Brady Statistic AS VS Percent: 0.01 %
Brady Statistic RA Percent Paced: 43.8 %
Brady Statistic RV Percent Paced: 99.99 %
Date Time Interrogation Session: 20240505231645
Implantable Lead Connection Status: 753985
Implantable Lead Connection Status: 753985
Implantable Lead Implant Date: 20120816
Implantable Lead Implant Date: 20120816
Implantable Lead Location: 753859
Implantable Lead Location: 753860
Implantable Lead Model: 5076
Implantable Lead Model: 5076
Implantable Pulse Generator Implant Date: 20230205
Lead Channel Impedance Value: 323 Ohm
Lead Channel Impedance Value: 380 Ohm
Lead Channel Impedance Value: 418 Ohm
Lead Channel Impedance Value: 589 Ohm
Lead Channel Pacing Threshold Amplitude: 0.5 V
Lead Channel Pacing Threshold Amplitude: 1.125 V
Lead Channel Pacing Threshold Pulse Width: 0.4 ms
Lead Channel Pacing Threshold Pulse Width: 0.4 ms
Lead Channel Sensing Intrinsic Amplitude: 1.875 mV
Lead Channel Sensing Intrinsic Amplitude: 1.875 mV
Lead Channel Setting Pacing Amplitude: 1.5 V
Lead Channel Setting Pacing Amplitude: 2.25 V
Lead Channel Setting Pacing Pulse Width: 0.4 ms
Lead Channel Setting Sensing Sensitivity: 0.9 mV
Zone Setting Status: 755011
Zone Setting Status: 755011

## 2023-05-19 ENCOUNTER — Other Ambulatory Visit: Payer: Self-pay | Admitting: Internal Medicine

## 2023-05-19 DIAGNOSIS — Z1231 Encounter for screening mammogram for malignant neoplasm of breast: Secondary | ICD-10-CM

## 2023-06-02 NOTE — Progress Notes (Signed)
Remote pacemaker transmission.   

## 2023-06-30 ENCOUNTER — Encounter: Payer: Self-pay | Admitting: Internal Medicine

## 2023-06-30 ENCOUNTER — Ambulatory Visit: Payer: Medicare Other | Attending: Internal Medicine | Admitting: Internal Medicine

## 2023-06-30 VITALS — BP 116/64 | HR 87 | Ht 63.0 in | Wt 132.2 lb

## 2023-06-30 DIAGNOSIS — I442 Atrioventricular block, complete: Secondary | ICD-10-CM

## 2023-06-30 NOTE — Progress Notes (Signed)
HPI Katrina Hunter returns today for followup. She is a pleasant 82 yo woman with a h/o CHB s/p PPM insertion. The patient underwent PPM gen change out in 2/23. She has done well since. She admits to being a little more sedentary. 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 She denies chest pain, sob, syncope or peripheral edema.  Allergies  Allergen Reactions   Ace Inhibitors Other (See Comments)    Lip swelling   Crestor [Rosuvastatin Calcium] Other (See Comments)    myalgia   Sulfonamide Derivatives Rash     Current Outpatient Medications  Medication Sig Dispense Refill   amLODipine (NORVASC) 10 MG tablet Take 1 tablet (10 mg total) by mouth daily. 90 tablet 3   cetirizine (ZYRTEC) 10 MG tablet Take 10 mg by mouth at bedtime.     cholecalciferol (VITAMIN D) 1000 UNITS tablet Take 2,000 Units by mouth in the morning.     Cyanocobalamin (B-12) 3000 MCG CAPS Take 3,000 mcg by mouth daily.     ezetimibe (ZETIA) 10 MG tablet Take 1 tablet (10 mg total) by mouth daily. 90 tablet 3   potassium chloride (KLOR-CON) 8 MEQ tablet Take 16 mEq by mouth in the morning.     pravastatin (PRAVACHOL) 20 MG tablet Take 1 tablet (20 mg total) by mouth at bedtime. 90 tablet 3   triamcinolone (NASACORT) 55 MCG/ACT AERO nasal inhaler Place 1-2 sprays into the nose daily as needed (allergies.).     triamterene-hydrochlorothiazide (MAXZIDE-25) 37.5-25 MG tablet Take 0.5 tablets by mouth daily. 45 tablet 3   levothyroxine (SYNTHROID) 50 MCG tablet Take 50 mcg by mouth daily before breakfast.     No current facility-administered medications for this visit.     Past Medical History:  Diagnosis Date   Aneurysm ([facility])    aortic    AV block, complete ([facility])    PPM placed 07/2011 [facility]   Headache    migraines   Heart murmur    History of prolapse of bladder    HLD (hyperlipidemia)    HTN (hypertension)    Hypothyroidism     no rx   Lung cancer ([facility]) dx'd 06/2017   Pacemaker 12   Pulmonary nodule, left  04/22/2017   From order notes    ROS:   All systems reviewed and negative except as noted in the HPI.   Past Surgical History:  Procedure Laterality Date   BLADDER SURGERY  01/03/2017   LYMPH NODE DISSECTION Left 06/29/2017   Procedure: LYMPH NODE DISSECTION LEFT LUNG;  Surgeon: Loreli Slot, MD;  [facility] OR;  Service: Thoracic;  Laterality: Left;   PACEMAKER INSERTION  07/2011   Donnie Aho   PPM GENERATOR CHANGEOUT N/A 01/31/2022   Procedure: PPM GENERATOR CHANGEOUT;  Surgeon: Marinus Maw, MD;  [facility];  Service: Cardiovascular;  Laterality: N/A;   TONSILLECTOMY  age 70   TUBAL LIGATION     VIDEO ASSISTED THORACOSCOPY (VATS)/WEDGE RESECTION Left 06/29/2017   Procedure: VIDEO ASSISTED THORACOSCOPY (VATS)/WEDGE RESECTION;  Surgeon: Loreli Slot, MD;  [facility];  Service: Thoracic;  Laterality: Left;   VIDEO BRONCHOSCOPY WITH ENDOBRONCHIAL NAVIGATION N/A 12/05/2013   Procedure: VIDEO BRONCHOSCOPY WITH ENDOBRONCHIAL NAVIGATION;  Surgeon: Leslye Peer, MD;  [facility];  Service: Thoracic;  Laterality: N/A;     Family History  Problem Relation Age of Onset   Heart disease Father    Breast cancer Mother 54  Social History   Socioeconomic History   Marital status: Married    Spouse name: Not on file   Number of children: 3   Years of education: Not on file   Highest education level: Not on file  Occupational History   Occupation: Psychologist, sport and exercise: [employer]  Tobacco Use   Smoking status: Never   Smokeless tobacco: Never  Vaping Use   Vaping Use: Never used  Substance and Sexual Activity   Alcohol use: No   Drug use: No   Sexual activity: Not on file  Other Topics Concern   Not on file  Social History Narrative   Retired.    Social Determinants of Health   Financial Resource Strain: Not on file  Food Insecurity: Not on file  Transportation Needs: Not on file  Physical Activity: Not on file  Stress: Not on  file  Social Connections: Not on file  Intimate Partner Violence: Not on file     BP 116/64   Pulse 87   Ht 5\' 3"  (1.6 m)   Wt 132 lb 3.2 oz (60 kg)   SpO2 96%   BMI 23.42 kg/m   Physical Exam:  Well appearing NAD HEENT: Unremarkable Neck:  No JVD, no thyromegally Lymphatics:  No adenopathy Back:  No CVA tenderness Lungs:  Clear with no wheezes HEART:  Regular rate rhythm, no murmurs, no rubs, no clicks Abd:  soft, positive bowel sounds, no organomegally, no rebound, no guarding Ext:  2 plus pulses, no edema, no cyanosis, no clubbing Skin:  No rashes no nodules Neuro:  CN II through XII intact, motor grossly intact  EKG - AV pacing  DEVICE  Normal device function.  See PaceArt for details.   Assess/Plan: CHB - she is doing well, s/p PPM insertion. PPM - her Medtronic DDD PM is working normally.  HTN - her bp is well controlled. We will follow.  Katrina Gowda Favor Kreh,MD

## 2023-06-30 NOTE — Patient Instructions (Signed)
Medication Instructions:  Your physician recommends that you continue on your current medications as directed. Please refer to the Current Medication list given to you today.  *If you need a refill on your cardiac medications before your next appointment, please call your pharmacy*  Follow-Up: At [facility], you and your health needs are our priority.  As part of our continuing mission to provide you with exceptional heart care, we have created designated Provider Care Teams.  These Care Teams include your primary Cardiologist (physician) and Advanced Practice Providers (APPs -  Physician Assistants and Nurse Practitioners) who all work together to provide you with the care you need, when you need it.  Your next appointment:   1 year(s)  Provider:   You may see Gregg Taylor, MD or one of the following Advanced Practice Providers on your designated Care Team:   Renee Ursuy, PA-C Michael "Andy" Tillery, PA-C Suzann Riddle, NP Brandi Ollis, NP    

## 2023-07-16 ENCOUNTER — Ambulatory Visit
Admission: RE | Admit: 2023-07-16 | Discharge: 2023-07-16 | Disposition: A | Discharge status: Home / Self Care | Payer: Medicare Other | Source: Ambulatory Visit | Attending: Internal Medicine | Admitting: Internal Medicine

## 2023-07-16 DIAGNOSIS — Z1231 Encounter for screening mammogram for malignant neoplasm of breast: Secondary | ICD-10-CM

## 2023-08-03 ENCOUNTER — Ambulatory Visit (INDEPENDENT_AMBULATORY_CARE_PROVIDER_SITE_OTHER): Payer: Medicare Other

## 2023-08-03 DIAGNOSIS — I442 Atrioventricular block, complete: Secondary | ICD-10-CM | POA: Diagnosis not present

## 2023-08-14 NOTE — Progress Notes (Signed)
Remote pacemaker transmission.   

## 2023-11-02 ENCOUNTER — Ambulatory Visit (INDEPENDENT_AMBULATORY_CARE_PROVIDER_SITE_OTHER): Payer: Medicare Other

## 2023-11-02 DIAGNOSIS — I442 Atrioventricular block, complete: Secondary | ICD-10-CM

## 2023-11-02 LAB — CUP PACEART REMOTE DEVICE CHECK
Battery Remaining Longevity: 111 mo
Battery Voltage: 3.02 V
Brady Statistic AP VP Percent: 58.49 %
Brady Statistic AP VS Percent: 0 %
Brady Statistic AS VP Percent: 41.48 %
Brady Statistic AS VS Percent: 0.03 %
Brady Statistic RA Percent Paced: 58.47 %
Brady Statistic RV Percent Paced: 99.97 %
Date Time Interrogation Session: 20241103212939
Implantable Lead Connection Status: 753985
Implantable Lead Connection Status: 753985
Implantable Lead Implant Date: 20120816
Implantable Lead Implant Date: 20120816
Implantable Lead Location: 753859
Implantable Lead Location: 753860
Implantable Lead Model: 5076
Implantable Lead Model: 5076
Implantable Pulse Generator Implant Date: 20230205
Lead Channel Impedance Value: 304 Ohm
Lead Channel Impedance Value: 380 Ohm
Lead Channel Impedance Value: 437 Ohm
Lead Channel Impedance Value: 608 Ohm
Lead Channel Pacing Threshold Amplitude: 0.625 V
Lead Channel Pacing Threshold Amplitude: 1 V
Lead Channel Pacing Threshold Pulse Width: 0.4 ms
Lead Channel Pacing Threshold Pulse Width: 0.4 ms
Lead Channel Sensing Intrinsic Amplitude: 2.25 mV
Lead Channel Sensing Intrinsic Amplitude: 2.25 mV
Lead Channel Sensing Intrinsic Amplitude: 6 mV
Lead Channel Sensing Intrinsic Amplitude: 6 mV
Lead Channel Setting Pacing Amplitude: 1.5 V
Lead Channel Setting Pacing Amplitude: 2.25 V
Lead Channel Setting Pacing Pulse Width: 0.4 ms
Lead Channel Setting Sensing Sensitivity: 0.9 mV
Zone Setting Status: 755011
Zone Setting Status: 755011

## 2023-11-23 NOTE — Progress Notes (Signed)
Remote pacemaker transmission.   

## 2024-02-01 ENCOUNTER — Ambulatory Visit (INDEPENDENT_AMBULATORY_CARE_PROVIDER_SITE_OTHER): Payer: Medicare Other

## 2024-02-01 DIAGNOSIS — I442 Atrioventricular block, complete: Secondary | ICD-10-CM

## 2024-02-01 LAB — CUP PACEART REMOTE DEVICE CHECK
Battery Remaining Longevity: 118 mo
Battery Voltage: 3.02 V
Brady Statistic AP VP Percent: 51.14 %
Brady Statistic AP VS Percent: 0 %
Brady Statistic AS VP Percent: 48.82 %
Brady Statistic AS VS Percent: 0.04 %
Brady Statistic RA Percent Paced: 51.12 %
Brady Statistic RV Percent Paced: 99.95 %
Date Time Interrogation Session: 20250202205639
Implantable Lead Connection Status: 753985
Implantable Lead Connection Status: 753985
Implantable Lead Implant Date: 20120816
Implantable Lead Implant Date: 20120816
Implantable Lead Location: 753859
Implantable Lead Location: 753860
Implantable Lead Model: 5076
Implantable Lead Model: 5076
Implantable Pulse Generator Implant Date: 20230205
Lead Channel Impedance Value: 323 Ohm
Lead Channel Impedance Value: 380 Ohm
Lead Channel Impedance Value: 437 Ohm
Lead Channel Impedance Value: 608 Ohm
Lead Channel Pacing Threshold Amplitude: 0.625 V
Lead Channel Pacing Threshold Amplitude: 1 V
Lead Channel Pacing Threshold Pulse Width: 0.4 ms
Lead Channel Pacing Threshold Pulse Width: 0.4 ms
Lead Channel Sensing Intrinsic Amplitude: 2.375 mV
Lead Channel Sensing Intrinsic Amplitude: 2.375 mV
Lead Channel Sensing Intrinsic Amplitude: 6 mV
Lead Channel Sensing Intrinsic Amplitude: 6 mV
Lead Channel Setting Pacing Amplitude: 1.5 V
Lead Channel Setting Pacing Amplitude: 2 V
Lead Channel Setting Pacing Pulse Width: 0.4 ms
Lead Channel Setting Sensing Sensitivity: 0.9 mV
Zone Setting Status: 755011
Zone Setting Status: 755011

## 2024-03-09 NOTE — Progress Notes (Signed)
 Remote pacemaker transmission.

## 2024-05-02 ENCOUNTER — Ambulatory Visit (INDEPENDENT_AMBULATORY_CARE_PROVIDER_SITE_OTHER): Payer: Medicare Other

## 2024-05-02 DIAGNOSIS — I442 Atrioventricular block, complete: Secondary | ICD-10-CM | POA: Diagnosis not present

## 2024-05-03 ENCOUNTER — Encounter: Payer: Self-pay | Admitting: Internal Medicine

## 2024-05-03 LAB — CUP PACEART REMOTE DEVICE CHECK
Battery Remaining Longevity: 114 mo
Battery Voltage: 3.01 V
Brady Statistic AP VP Percent: 30.98 %
Brady Statistic AP VS Percent: 0 %
Brady Statistic AS VP Percent: 69.01 %
Brady Statistic AS VS Percent: 0.01 %
Brady Statistic RA Percent Paced: 30.91 %
Brady Statistic RV Percent Paced: 99.99 %
Date Time Interrogation Session: 20250504203053
Implantable Lead Connection Status: 753985
Implantable Lead Connection Status: 753985
Implantable Lead Implant Date: 20120816
Implantable Lead Implant Date: 20120816
Implantable Lead Location: 753859
Implantable Lead Location: 753860
Implantable Lead Model: 5076
Implantable Lead Model: 5076
Implantable Pulse Generator Implant Date: 20230205
Lead Channel Impedance Value: 323 Ohm
Lead Channel Impedance Value: 361 Ohm
Lead Channel Impedance Value: 475 Ohm
Lead Channel Impedance Value: 589 Ohm
Lead Channel Pacing Threshold Amplitude: 0.625 V
Lead Channel Pacing Threshold Amplitude: 1 V
Lead Channel Pacing Threshold Pulse Width: 0.4 ms
Lead Channel Pacing Threshold Pulse Width: 0.4 ms
Lead Channel Sensing Intrinsic Amplitude: 2.25 mV
Lead Channel Sensing Intrinsic Amplitude: 2.25 mV
Lead Channel Sensing Intrinsic Amplitude: 4.625 mV
Lead Channel Sensing Intrinsic Amplitude: 4.625 mV
Lead Channel Setting Pacing Amplitude: 1.5 V
Lead Channel Setting Pacing Amplitude: 2 V
Lead Channel Setting Pacing Pulse Width: 0.4 ms
Lead Channel Setting Sensing Sensitivity: 0.9 mV
Zone Setting Status: 755011
Zone Setting Status: 755011

## 2024-05-16 ENCOUNTER — Other Ambulatory Visit: Payer: Self-pay | Admitting: Internal Medicine

## 2024-05-16 DIAGNOSIS — Z0000 Encounter for general adult medical examination without abnormal findings: Secondary | ICD-10-CM

## 2024-05-16 DIAGNOSIS — I7121 Aneurysm of the ascending aorta, without rupture: Secondary | ICD-10-CM

## 2024-05-24 ENCOUNTER — Ambulatory Visit
Admission: RE | Admit: 2024-05-24 | Discharge: 2024-05-24 | Disposition: A | Discharge status: Home / Self Care | Source: Ambulatory Visit | Attending: Internal Medicine | Admitting: Internal Medicine

## 2024-05-24 DIAGNOSIS — I7121 Aneurysm of the ascending aorta, without rupture: Secondary | ICD-10-CM | POA: Insufficient documentation

## 2024-05-24 DIAGNOSIS — Z0000 Encounter for general adult medical examination without abnormal findings: Secondary | ICD-10-CM | POA: Diagnosis present

## 2024-05-24 MED ORDER — IOHEXOL 350 MG/ML SOLN
75.0000 mL | Freq: Once | INTRAVENOUS | Status: AC | PRN
  Administered 2024-05-24: 75 mL via INTRAVENOUS

## 2024-06-20 NOTE — Addendum Note (Signed)
 Addended by: TAWNI DRILLING D on: 06/20/2024 10:46 AM   Modules accepted: Orders

## 2024-06-20 NOTE — Progress Notes (Signed)
 Remote pacemaker transmission.

## 2024-07-06 ENCOUNTER — Other Ambulatory Visit: Payer: Self-pay | Admitting: Physician Assistant

## 2024-07-06 ENCOUNTER — Ambulatory Visit
Admission: RE | Admit: 2024-07-06 | Discharge: 2024-07-06 | Disposition: A | Discharge status: Home / Self Care | Source: Ambulatory Visit | Attending: Physician Assistant | Admitting: Physician Assistant

## 2024-07-06 DIAGNOSIS — M79604 Pain in right leg: Secondary | ICD-10-CM | POA: Diagnosis present

## 2024-08-01 ENCOUNTER — Ambulatory Visit: Payer: Medicare Other

## 2024-08-01 DIAGNOSIS — I442 Atrioventricular block, complete: Secondary | ICD-10-CM

## 2024-08-02 ENCOUNTER — Ambulatory Visit: Payer: Self-pay | Admitting: Internal Medicine

## 2024-08-02 LAB — CUP PACEART REMOTE DEVICE CHECK
Battery Remaining Longevity: 101 mo
Battery Voltage: 3.01 V
Brady Statistic AP VP Percent: 37.88 %
Brady Statistic AP VS Percent: 0 %
Brady Statistic AS VP Percent: 62.11 %
Brady Statistic AS VS Percent: 0.01 %
Brady Statistic RA Percent Paced: 37.84 %
Brady Statistic RV Percent Paced: 99.99 %
Date Time Interrogation Session: 20250803230707
Implantable Lead Connection Status: 753985
Implantable Lead Connection Status: 753985
Implantable Lead Implant Date: 20120816
Implantable Lead Implant Date: 20120816
Implantable Lead Location: 753859
Implantable Lead Location: 753860
Implantable Lead Model: 5076
Implantable Lead Model: 5076
Implantable Pulse Generator Implant Date: 20230205
Lead Channel Impedance Value: 304 Ohm
Lead Channel Impedance Value: 361 Ohm
Lead Channel Impedance Value: 456 Ohm
Lead Channel Impedance Value: 608 Ohm
Lead Channel Pacing Threshold Amplitude: 0.625 V
Lead Channel Pacing Threshold Amplitude: 1.125 V
Lead Channel Pacing Threshold Pulse Width: 0.4 ms
Lead Channel Pacing Threshold Pulse Width: 0.4 ms
Lead Channel Sensing Intrinsic Amplitude: 2.125 mV
Lead Channel Sensing Intrinsic Amplitude: 2.125 mV
Lead Channel Sensing Intrinsic Amplitude: 4.625 mV
Lead Channel Sensing Intrinsic Amplitude: 4.625 mV
Lead Channel Setting Pacing Amplitude: 1.5 V
Lead Channel Setting Pacing Amplitude: 2.25 V
Lead Channel Setting Pacing Pulse Width: 0.4 ms
Lead Channel Setting Sensing Sensitivity: 0.9 mV
Zone Setting Status: 755011
Zone Setting Status: 755011

## 2024-08-08 NOTE — Progress Notes (Deleted)
 Cardiology Clinic Note   Date: 08/08/2024 ID: Katrina Hunter, DOB 19-Mar-1941, MRN [id number]   HeartCare Providers Cardiologist:  Evalene Lunger, MD Electrophysiologist:  Danelle Birmingham, MD     Chief Complaint   Katrina Hunter is a 83 y.o. female who presents to the clinic today for ***  Patient Profile   Katrina Hunter is followed by Dr. Gollan and Dr. Birmingham for the history outlined below.      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 GEN change out 01/31/2022. Remote device check 07/31/2024: Normal device function.  AV dual paced.  Battery and lead parameters stable with stable capture and sensing.  Programming is appropriate. Thoracic ascending aortic aneurysm.  Echo 11/18/2021: EF 55 to 60%.  No RWMA.  Mild LVH of the basal-septal segment.  Grade I DD.  Normal global strain.  Normal RV size/function.  Mild MR.  Aortic valve sclerosis without stenosis.  Mild dilatation of aortic root 39 mm, moderate dilatation of ascending aorta 47 mm. CTA chest aorta 05/24/2024: Marginal interval increase in size of the ascending thoracic aorta which now measures 4.6 cm (previously 4.4 cm). Hypertension. Hyperlipidemia. Lipid panel 05/05/2024: LDL 79, HDL 50, TG 174, total 163. Lung cancer. S/p wedge resection July 2018. Hypothyroidism.  In summary, patient was first seen by Dr. Birmingham on 08/07/2011 for complete heart block.  She underwent insertion of dual-chamber pacemaker on 08/14/2011.  Patient established care with Dr. Gollan via telehealth visit on 04/04/2019.  She was doing well at that time.  It was recommended patient add Zetia .  She wanted to change EP care to [facility] as it was closer to her home.  She established care with Dr. Fernande on 04/11/2019 via telehealth visit.  Last echo November 2022 showed normal LV/RV function with mild dilatation of aortic root 39 mm and moderate dilatation of ascending aorta 47 mm.  She underwent pacemaker generator  change out in February 2023.  Last CTA chest aorta May 2025 showed marginal interval increase in size of ascending thoracic aorta.  Patient was last seen in the office by Dr. Gollan on 11/18/2022 for routine follow-up.  She was doing well at that time.  She was unable to get Repatha  through her insurance.  She was continued on pravastatin  and Zetia .  BP was well-controlled at the time of her visit.  She was seen by Dr. Birmingham on 06/30/2023 for routine follow-up.  She was doing well at that time and no changes were made.     History of Present Illness    Today, patient ***  Complete heart block S/p PPM August 2012 with a GEN change LVOT February 2023.  Remote check on August 2025 demonstrated normal device function, AV dual pacing, battery and lead parameters stable with stable capture and sensing, device programming appropriate.  Patient*** - Continue to follow with EP.***  Thoracic ascending aortic aneurysm CTA chest aorta May 2025 showed marginal interval increase in size of ascending thoracic aorta 4.6 cm (previously 4.4 cm).  Patient denies chest pain, back pain, abdominal pain, lightheadedness, dizziness, presyncope, syncope. - Continue yearly surveillance.  Hypertension BP today*** - Continue amlodipine , Maxide.  Hyperlipidemia LDL 79 May 2025, at goal. - Continue pravastatin  and Zetia .  ROS: All other systems reviewed and are otherwise negative except as noted in History of Present Illness.  EKGs/Labs Reviewed        No results found for requested labs within last 365 days.   No results  found for requested labs within last 365 days.   No results found for requested labs within last 365 days.   No results found for requested labs within last 365 days.  ***  Risk Assessment/Calculations    {Does this patient have ATRIAL FIBRILLATION?:[phone]} No BP recorded.  {Refresh Note OR Click here to enter BP  :1}***        Physical Exam    VS:  There were no vitals taken  for this visit. , BMI There is no height or weight on file to calculate BMI.  GEN: Well nourished, well developed, in no acute distress. Neck: No JVD or carotid bruits. Cardiac: *** RRR. *** No murmur. No rubs or gallops.   Respiratory:  Respirations regular and unlabored. Clear to auscultation without rales, wheezing or rhonchi. GI: Soft, nontender, nondistended. Extremities: Radials/DP/PT 2+ and equal bilaterally. No clubbing or cyanosis. No edema ***  Skin: Warm and dry, no rash. Neuro: Strength intact.  Assessment & Plan   ***  Disposition: ***     {Are you ordering a CV Procedure (e.g. stress test, cath, DCCV, TEE, etc)?   Press F2        :[id number]}   Signed, Katrina HERO. Sativa Gelles, DNP, NP-C

## 2024-08-10 ENCOUNTER — Ambulatory Visit: Admitting: Student

## 2024-08-20 ENCOUNTER — Emergency Department

## 2024-08-20 ENCOUNTER — Emergency Department
Admission: EM | Admit: 2024-08-20 | Discharge: 2024-08-20 | Disposition: A | Discharge status: Home / Self Care | Attending: Emergency Medicine | Admitting: Emergency Medicine

## 2024-08-20 ENCOUNTER — Other Ambulatory Visit: Payer: Self-pay

## 2024-08-20 ENCOUNTER — Encounter: Payer: Self-pay | Admitting: Emergency Medicine

## 2024-08-20 DIAGNOSIS — E039 Hypothyroidism, unspecified: Secondary | ICD-10-CM | POA: Insufficient documentation

## 2024-08-20 DIAGNOSIS — I10 Essential (primary) hypertension: Secondary | ICD-10-CM | POA: Diagnosis not present

## 2024-08-20 DIAGNOSIS — M11261 Other chondrocalcinosis, right knee: Secondary | ICD-10-CM | POA: Diagnosis not present

## 2024-08-20 DIAGNOSIS — E876 Hypokalemia: Secondary | ICD-10-CM | POA: Insufficient documentation

## 2024-08-20 DIAGNOSIS — M25561 Pain in right knee: Secondary | ICD-10-CM | POA: Diagnosis present

## 2024-08-20 DIAGNOSIS — Z85118 Personal history of other malignant neoplasm of bronchus and lung: Secondary | ICD-10-CM | POA: Insufficient documentation

## 2024-08-20 DIAGNOSIS — M25461 Effusion, right knee: Secondary | ICD-10-CM

## 2024-08-20 LAB — BASIC METABOLIC PANEL WITH GFR
Anion gap: 13 (ref 5–15)
BUN: 16 mg/dL (ref 8–23)
CO2: 25 mmol/L (ref 22–32)
Calcium: 9 mg/dL (ref 8.9–10.3)
Chloride: 99 mmol/L (ref 98–111)
Creatinine, Ser: 0.78 mg/dL (ref 0.44–1.00)
GFR, Estimated: >60 mL/min (ref >60)
Glucose, Bld: 100 mg/dL — ABNORMAL HIGH (ref 70–99)
Potassium: 2.9 mmol/L — ABNORMAL LOW (ref 3.5–5.1)
Sodium: 137 mmol/L (ref 135–145)

## 2024-08-20 LAB — SYNOVIAL CELL COUNT + DIFF, W/ CRYSTALS
Eosinophils-Synovial: 1 %
Lymphocytes-Synovial Fld: 5 %
Monocyte-Macrophage-Synovial Fluid: 5 %
Neutrophil, Synovial: 89 %
WBC, Synovial: 40772 /mm3 — ABNORMAL HIGH (ref 0–200)

## 2024-08-20 LAB — CBC
HCT: 37 % (ref 36.0–46.0)
Hemoglobin: 12.2 g/dL (ref 12.0–15.0)
MCH: 29.5 pg (ref 26.0–34.0)
MCHC: 33 g/dL (ref 30.0–36.0)
MCV: 89.4 fL (ref 80.0–100.0)
Platelets: 240 K/uL (ref 150–400)
RBC: 4.14 MIL/uL (ref 3.87–5.11)
RDW: 12.4 % (ref 11.5–15.5)
WBC: 7.4 K/uL (ref 4.0–10.5)
nRBC: 0 % (ref 0.0–0.2)

## 2024-08-20 LAB — MAGNESIUM: Magnesium: 2.2 mg/dL (ref 1.7–2.4)

## 2024-08-20 LAB — URIC ACID: Uric Acid, Serum: 6.2 mg/dL (ref 2.5–7.1)

## 2024-08-20 MED ORDER — OXYCODONE-ACETAMINOPHEN 7.5-325 MG PO TABS: 1.0000 | ORAL_TABLET | ORAL | 0 refills | Status: DC | PRN

## 2024-08-20 MED ORDER — OXYCODONE-ACETAMINOPHEN 5-325 MG PO TABS
1.0000 | ORAL_TABLET | Freq: Once | ORAL | Status: AC
  Administered 2024-08-20: 1 via ORAL
  Filled 2024-08-20: qty 1

## 2024-08-20 MED ORDER — LIDOCAINE HCL (PF) 1 % IJ SOLN: 5.0000 mL | Freq: Once | INTRAMUSCULAR | Status: DC

## 2024-08-20 MED ORDER — POTASSIUM CHLORIDE CRYS ER 20 MEQ PO TBCR
40.0000 meq | EXTENDED_RELEASE_TABLET | Freq: Once | ORAL | Status: AC
  Administered 2024-08-20: 40 meq via ORAL
  Filled 2024-08-20: qty 2

## 2024-08-20 MED ORDER — LIDOCAINE HCL (PF) 1 % IJ SOLN
5.0000 mL | Freq: Once | INTRAMUSCULAR | Status: AC
  Administered 2024-08-20: 5 mL
  Filled 2024-08-20: qty 5

## 2024-08-20 MED ORDER — OXYCODONE-ACETAMINOPHEN 7.5-325 MG PO TABS
1.0000 | ORAL_TABLET | ORAL | 0 refills | Status: AC | PRN
Start: 2024-08-20 — End: 2024-08-27

## 2024-08-20 MED ORDER — OXYCODONE HCL 5 MG PO TABS
10.0000 mg | ORAL_TABLET | Freq: Once | ORAL | Status: AC
  Administered 2024-08-20: 10 mg via ORAL
  Filled 2024-08-20: qty 2

## 2024-08-20 NOTE — ED Provider Notes (Signed)
 ----------------------------------------- 7:00 PM on 08/20/2024 -----------------------------------------  Blood pressure (!) 147/82, pulse 70, temperature 98.3 F (36.8 C), resp. rate 17, height 5' 3.5 (1.613 m), weight 55.8 kg, SpO2 95%.  Assuming care from [facility], PA-C/NP-C.  In short, Katrina Hunter is Hunter 83 y.o. female with Hunter chief complaint of Knee Pain .  Refer to the original H&P for additional details.  The current plan of care is to await pending synovial fluid cell count and differential, and disposition the patient accordingly.  Patient is present with her husband, and both are voicing their desire to leave the ED.  Patient noted some improvement of her symptoms after p.o. medication administration.  Has been given Hunter standard walker to ambulate with weightbearing as tolerated.  ____________________________________________    ED Results / Procedures / Treatments   Labs (all labs ordered are listed, but only abnormal results are displayed) Labs Reviewed  BASIC METABOLIC PANEL WITH GFR - Abnormal; Notable for the following components:      Result Value   Potassium 2.9 (*)    Glucose, Bld 100 (*)    All other components within normal limits  SYNOVIAL CELL COUNT + DIFF, W/ CRYSTALS - Abnormal; Notable for the following components:   Color, Synovial RED (*)    Appearance-Synovial TURBID (*)    WBC, Synovial 40,772 (*)    All other components within normal limits  BODY FLUID CULTURE W GRAM STAIN  CBC  URIC ACID  MAGNESIUM     EKG   RADIOLOGY  I personally viewed and evaluated these images as part of my medical decision making, as well as reviewing the written report by the radiologist.  ED Provider Interpretation: joint effusion and OA  DG Knee Complete 4 Views Right Result Date: 08/20/2024 EXAM: 4 VIEW(S) XRAY OF THE RIGHT KNEE 08/20/2024 02:39:14 PM COMPARISON: None available. CLINICAL HISTORY: Knee pain. Pt c/o R knee pain that started approx 3 weeks  ago, states last night she noticed swelling. FINDINGS: BONES AND JOINTS: Tricompartmental degenerative spurring. Moderate patellofemoral joint space narrowing and subchondral cystic change. Small to moderate joint effusion. Moderate chondrocalcinosis. No fracture or erosive change. SOFT TISSUES: The soft tissues are unremarkable. IMPRESSION: 1. Small to moderate joint effusion. 2. 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 Electronically signed by: Katrina Hunter 08/20/2024 03:07 PM EDT RP Workstation: [id number]    PROCEDURES:  Critical Care performed: No  Procedures   MEDICATIONS ORDERED IN ED: Medications  oxyCODONE -acetaminophen  (PERCOCET/ROXICET) 5-325 MG per tablet 1 tablet (1 tablet Oral Given 08/20/24 1500)  lidocaine  (PF) (XYLOCAINE ) 1 % injection 5 mL (5 mLs Infiltration Given by Other 08/20/24 1635)  potassium chloride  SA (KLOR-CON  M) CR tablet 40 mEq (40 mEq Oral Given 08/20/24 1755)  oxyCODONE  (Oxy IR/ROXICODONE ) immediate release tablet 10 mg (10 mg Oral Given 08/20/24 2113)     IMPRESSION / MDM / ASSESSMENT AND PLAN / ED COURSE  I reviewed the triage vital signs and the nursing notes.                              Differential diagnosis includes, but is not limited to, gouty arthritis, pseudogout, septic arthritis, inflammatory arthritis, sepsis  Patient's presentation is most consistent with acute complicated illness / injury requiring diagnostic workup.  Patient's diagnosis is consistent with pseudogout of the right knee.  Geriatric patient with acute knee pain and effusion presenting for evaluation.  I assumed care at the change of shift,  while the synovial fluid cell count and Gram stain were pending.  The synovial fluid returned showing calcium pyrophosphate crystals and Hunter WBC at 40,000.  Symptoms may represent an inflammatory arthritis on presentation.  I advised to patient and her husband that the fluid culture was still processing at this time  and had been delayed due to Hunter lab error.  Patient is inclined to go home and reevaluate with her primary provider.  I notified her that we would contact her via telephone for the Gram stain results when available and advise on treatment.  Patient will be discharged home with prescriptions for Percocet. Patient is to follow up with her PCP as suggested, as needed or otherwise directed. Patient is given ED precautions to return to the ED for any worsening or new symptoms.  Clinical Course as of 08/21/24 0035  Sat Aug 20, 2024  1746 Basic metabolic panel(!) Hypokalemia will replete  [MH]  1746 CBC wnl [MH]  1746 Uric acid Wnl  [MH]  1747 DG Knee Complete 4 Views Right IMPRESSION: 1. Small to moderate joint effusion. 2. 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   [MH]  1915 Magnesium wnl [MH]    Clinical Course User Index [MH] Margrette, Myah A, PA-C    FINAL CLINICAL IMPRESSION(S) / ED DIAGNOSES   Final diagnoses:  Effusion of right knee  Hypokalemia  Pseudogout of knee, right     Rx / DC Orders   ED Discharge Orders          Ordered    oxyCODONE -acetaminophen  (PERCOCET) 7.5-325 MG tablet  Every 4 hours PRN,   Status:  Discontinued        08/20/24 1930    oxyCODONE -acetaminophen  (PERCOCET) 7.5-325 MG tablet  Every 4 hours PRN        08/20/24 2105             Note:  This document was prepared using Dragon voice recognition software and may include unintentional dictation errors.    Katrina [name], PA-C 08/21/24 0037    Katrina Pae, Hunter 08/25/24 [phone]

## 2024-08-20 NOTE — ED Notes (Signed)
 Assisted pt to bathroom via wheelchair

## 2024-08-20 NOTE — Discharge Instructions (Addendum)
 You were evaluated in the ED for right knee pain and swelling.  Your x-ray shows a moderate joint effusion which was aspirated in the ED.  Also shows moderate arthritis.  Your lab work is reassuring.  Your potassium was fairly low in which we provided a potassium pill.  You will need to follow-up with your primary care provider in 1 week for repeat labs.  In the interim, limit physical activity.  Apply ice to the affected area 20 minutes on 20 minutes off.  Percocet has been sent to your pharmacy for pain.  Do not take Tylenol  with this medication.  If symptoms worsen please return to ED for further evaluation otherwise follow-up with your primary care provider.

## 2024-08-20 NOTE — ED Triage Notes (Signed)
 Pt via POV from home. Pt c/o R knee pain that started approx 3 weeks ago, states last night she noticed swelling. Reports OTC medications usually makes the pain better but today it did not. Pt is A&OX4 and NAD

## 2024-08-20 NOTE — ED Provider Notes (Signed)
 [facility] Emergency Department Provider Note     Event Date/Time   First MD Initiated Contact with Patient 08/20/24 1437     (approximate)   History   Knee Pain   HPI  Katrina Hunter is a 83 y.o. female with PMHx of HTN, lung cancer, HLD, and hypothyroidism presents to the ED for evaluation of right knee pain x 3 weeks ago.  Patient reports last night she noticed increased swelling and could not get up and walk or bear weight on the knee.  She denies known injury or falls to the knee.  Reports taking Tylenol  for pain with no relief.  No other complaint at this time.    Physical Exam   Triage Vital Signs: ED Triage Vitals  Encounter Vitals Group     BP 08/20/24 1340 (!) 155/75     Girls Systolic BP Percentile --      Girls Diastolic BP Percentile --      Boys Systolic BP Percentile --      Boys Diastolic BP Percentile --      Pulse Rate 08/20/24 1340 82     Resp 08/20/24 1340 18     Temp 08/20/24 1340 98.3 F (36.8 C)     Temp src --      SpO2 08/20/24 1340 96 %     Weight 08/20/24 1342 123 lb (55.8 kg)     Height 08/20/24 1342 5' 3.5 (1.613 m)     Head Circumference --      Peak Flow --      Pain Score 08/20/24 1342 10     Pain Loc --      Pain Education --      Exclude from Growth Chart --     Most recent vital signs: Vitals:   08/20/24 1800 08/20/24 1830  BP: (!) 164/75 (!) 168/88  Pulse:    Resp:    Temp:    SpO2:      General Awake, no distress.  HEENT NCAT.  CV:  Good peripheral perfusion.  RESP:  Normal effort.  ABD:  No distention.  Other:  Right knee reveals moderate edema.  Obvious joint effusion.  No redness.  There is mild warmth to palpation.  Tender to suprapatella region.  Limited range of motion secondary to swelling and pain.  Neurovascular status intact all throughout.   ED Results / Procedures / Treatments   Labs (all labs ordered are listed, but only abnormal results are displayed) Labs Reviewed   BASIC METABOLIC PANEL WITH GFR - Abnormal; Notable for the following components:      Result Value   Potassium 2.9 (*)    Glucose, Bld 100 (*)    All other components within normal limits  BODY FLUID CULTURE W GRAM STAIN  CBC  URIC ACID  MAGNESIUM  SYNOVIAL CELL COUNT + DIFF, W/ CRYSTALS    RADIOLOGY  I personally viewed and evaluated these images as part of my medical decision making, as well as reviewing the written report by the radiologist.  ED Provider Interpretation: no acute abnormalities to right knee. Moderate joint effusion.   DG Knee Complete 4 Views Right Result Date: 08/20/2024 EXAM: 4 VIEW(S) XRAY OF THE RIGHT KNEE 08/20/2024 02:39:14 PM COMPARISON: None available. CLINICAL HISTORY: Knee pain. Pt c/o R knee pain that started approx 3 weeks ago, states last night she noticed swelling. FINDINGS: BONES AND JOINTS: Tricompartmental degenerative spurring. Moderate patellofemoral joint space narrowing and subchondral  cystic change. Small to moderate joint effusion. Moderate chondrocalcinosis. No fracture or erosive change. SOFT TISSUES: The soft tissues are unremarkable. IMPRESSION: 1. Small to moderate joint effusion. 2. 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 Electronically signed by: Andrea Gasman MD 08/20/2024 03:07 PM EDT RP Workstation: [id number]    PROCEDURES:  Critical Care performed: No  .Joint Aspiration/Arthrocentesis  Date/Time: 08/20/2024 7:16 PM  Performed by: Margrette Monte A, PA-C Authorized by: Margrette Monte LABOR, PA-C   Consent:    Consent obtained:  Verbal   Consent given by:  Patient   Risks, benefits, and alternatives were discussed: yes     Risks discussed:  Bleeding, infection, pain, incomplete drainage, nerve damage and poor cosmetic result   Alternatives discussed:  No treatment Universal protocol:    Patient identity confirmed:  Verbally with patient Location:    Location:  Knee   Knee:  R knee Anesthesia:     Anesthesia method:  Local infiltration   Local anesthetic:  Lidocaine  1% w/o epi Procedure details:    Needle gauge:  18 G   Ultrasound guidance: no     Approach:  Medial   Aspirate amount:  25cc   Aspirate characteristics:  Blood-tinged   Steroid injected: no   Post-procedure details:    Dressing:  Adhesive bandage   Procedure completion:  Tolerated    MEDICATIONS ORDERED IN ED: Medications  oxyCODONE -acetaminophen  (PERCOCET/ROXICET) 5-325 MG per tablet 1 tablet (1 tablet Oral Given 08/20/24 1500)  lidocaine  (PF) (XYLOCAINE ) 1 % injection 5 mL (5 mLs Infiltration Given by Other 08/20/24 1635)  potassium chloride  SA (KLOR-CON  M) CR tablet 40 mEq (40 mEq Oral Given 08/20/24 1755)    IMPRESSION / MDM / ASSESSMENT AND PLAN / ED COURSE  I reviewed the triage vital signs and the nursing notes.                              Clinical Course as of 08/20/24 1925  Sat Aug 20, 2024  1746 Basic metabolic panel(!) Hypokalemia will replete  [MH]  1746 CBC wnl [MH]  1746 Uric acid Wnl  [MH]  1747 DG Knee Complete 4 Views Right IMPRESSION: 1. Small to moderate joint effusion. 2. 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   [MH]  1915 Magnesium wnl [MH]    Clinical Course User Index [MH] Margrette Monte A, PA-C    83 y.o. female presents to the emergency department for evaluation and treatment of acute right knee swelling. See HPI for further details.   Differential diagnosis includes, but is not limited to septic joint, gout, fracture, bursitis  Patient's presentation is most consistent with acute complicated illness / injury requiring diagnostic workup.  Patient is alert and oriented.  She is hemodynamically stable.  She is well-appearing on initial assessment.  Physical exam findings are stated above and pertinent for moderate edema to right knee without erythema or warmth.  Joint aspiration performed.  Please see procedure note to evaluate septic joint.   Bloody tinged fluid was aspirated and on reassessment patient able to stand and take a few steps.  Improvement since initial assessment.   Synovial cell count is still pending.  Discussed admission with patient however she would not like this option.  She lives at home with her husband who will help take care of her.  I believe this is reasonable as her pain has improved.  Will order walker and reassessed safety for discharge home following results of lab.  Transferring care to [facility], PA-C who will make appropriate disposition following synovial lab result and reassessment.  FINAL CLINICAL IMPRESSION(S) / ED DIAGNOSES   Final diagnoses:  Effusion of right knee   Rx / DC Orders   ED Discharge Orders     None        Note:  This document was prepared using Dragon voice recognition software and may include unintentional dictation errors.    Margrette, Vedanshi Massaro A, PA-C 08/20/24 1932    Dorothyann Drivers, MD 08/21/24 2255

## 2024-08-24 LAB — BODY FLUID CULTURE W GRAM STAIN: Culture: NO GROWTH

## 2024-08-31 NOTE — Progress Notes (Deleted)
 Cardiology Clinic Note   Date: 08/31/2024 ID: LACRESIA DARWISH, DOB 1941-09-02, MRN [id number]  [facility] Providers Cardiologist:  Evalene Lunger, MD Electrophysiologist:  Danelle Birmingham, MD     Chief Complaint   KATELYNE GALSTER is a 83 y.o. female who presents to the clinic today for ***  Patient Profile   SABEL HORNBECK is followed by Dr. Gollan and Dr. Birmingham for the history outlined below.      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 GEN change out 01/31/2022. Remote device check 07/31/2024: Normal device function.  AV dual paced.  Battery and lead parameters stable with stable capture and sensing.  Programming is appropriate. Thoracic ascending aortic aneurysm.  Echo 11/18/2021: EF 55 to 60%.  No RWMA.  Mild LVH of the basal-septal segment.  Grade I DD.  Normal global strain.  Normal RV size/function.  Mild MR.  Aortic valve sclerosis without stenosis.  Mild dilatation of aortic root 39 mm, moderate dilatation of ascending aorta 47 mm. CTA chest aorta 05/24/2024: Marginal interval increase in size of the ascending thoracic aorta which now measures 4.6 cm (previously 4.4 cm). Hypertension. Hyperlipidemia. Lipid panel 05/05/2024: LDL 79, HDL 50, TG 174, total 163. Lung cancer. S/p wedge resection July 2018. Hypothyroidism.  In summary, patient was first seen by Dr. Birmingham on 08/07/2011 for complete heart block.  She underwent insertion of dual-chamber pacemaker on 08/14/2011.  Patient established care with Dr. Gollan via telehealth visit on 04/04/2019.  She was doing well at that time.  It was recommended patient add Zetia .  She wanted to change EP care to [facility] as it was closer to her home.  She established care with Dr. Fernande on 04/11/2019 via telehealth visit.  Last echo November 2022 showed normal LV/RV function with mild dilatation of aortic root 39 mm and moderate dilatation of ascending aorta 47 mm.  She underwent pacemaker generator  change out in February 2023.  Last CTA chest aorta May 2025 showed marginal interval increase in size of ascending thoracic aorta.  Patient was last seen in the office by Dr. Gollan on 11/18/2022 for routine follow-up.  She was doing well at that time.  She was unable to get Repatha  through her insurance.  She was continued on pravastatin  and Zetia .  BP was well-controlled at the time of her visit.  She was seen by Dr. Birmingham on 06/30/2023 for routine follow-up.  She was doing well at that time and no changes were made.     History of Present Illness    Today, patient ***  Complete heart block S/p PPM August 2012 with a GEN change LVOT February 2023.  Remote check on August 2025 demonstrated normal device function, AV dual pacing, battery and lead parameters stable with stable capture and sensing, device programming appropriate.  Patient*** - Continue to follow with EP.***  Thoracic ascending aortic aneurysm CTA chest aorta May 2025 showed marginal interval increase in size of ascending thoracic aorta 4.6 cm (previously 4.4 cm).  Patient denies chest pain, back pain, abdominal pain, lightheadedness, dizziness, presyncope, syncope. - Continue yearly surveillance.  Hypertension BP today*** - Continue amlodipine , Maxide.  Hyperlipidemia LDL 79 May 2025, at goal. - Continue pravastatin  and Zetia .  ROS: All other systems reviewed and are otherwise negative except as noted in History of Present Illness.  EKGs/Labs Reviewed        08/20/2024: BUN 16; Creatinine, Ser 0.78; Potassium 2.9; Sodium 137   08/20/2024: Hemoglobin  12.2; WBC 7.4   No results found for requested labs within last 365 days.   No results found for requested labs within last 365 days.  ***  Risk Assessment/Calculations    {Does this patient have ATRIAL FIBRILLATION?[phone]} No BP recorded.  {Refresh Note OR Click here to enter BP  :1}***        Physical Exam    VS:  There were no vitals taken for this  visit. , BMI There is no height or weight on file to calculate BMI.  GEN: Well nourished, well developed, in no acute distress. Neck: No JVD or carotid bruits. Cardiac: *** RRR. *** No murmur. No rubs or gallops.   Respiratory:  Respirations regular and unlabored. Clear to auscultation without rales, wheezing or rhonchi. GI: Soft, nontender, nondistended. Extremities: Radials/DP/PT 2+ and equal bilaterally. No clubbing or cyanosis. No edema ***  Skin: Warm and dry, no rash. Neuro: Strength intact.  Assessment & Plan   ***  Disposition: ***     {Are you ordering a CV Procedure (e.g. stress test, cath, DCCV, TEE, etc)?   Press F2        :[id number]}   Signed, Barnie HERO. Ewell Benassi, DNP, NP-C

## 2024-09-05 ENCOUNTER — Ambulatory Visit: Admitting: Student

## 2024-09-26 NOTE — Progress Notes (Signed)
 Remote PPM Transmission

## 2024-10-31 ENCOUNTER — Ambulatory Visit (INDEPENDENT_AMBULATORY_CARE_PROVIDER_SITE_OTHER): Payer: Medicare Other

## 2024-10-31 DIAGNOSIS — I442 Atrioventricular block, complete: Secondary | ICD-10-CM

## 2024-11-01 LAB — CUP PACEART REMOTE DEVICE CHECK
Battery Remaining Longevity: 97 mo
Battery Voltage: 3.01 V
Brady Statistic AP VP Percent: 28.46 %
Brady Statistic AP VS Percent: 0 %
Brady Statistic AS VP Percent: 71.53 %
Brady Statistic AS VS Percent: 0.01 %
Brady Statistic RA Percent Paced: 28.42 %
Brady Statistic RV Percent Paced: 99.99 %
Date Time Interrogation Session: 20251102210653
Implantable Lead Connection Status: 753985
Implantable Lead Connection Status: 753985
Implantable Lead Implant Date: 20120816
Implantable Lead Implant Date: 20120816
Implantable Lead Location: 753859
Implantable Lead Location: 753860
Implantable Lead Model: 5076
Implantable Lead Model: 5076
Implantable Pulse Generator Implant Date: 20230205
Lead Channel Impedance Value: 304 Ohm
Lead Channel Impedance Value: 361 Ohm
Lead Channel Impedance Value: 418 Ohm
Lead Channel Impedance Value: 570 Ohm
Lead Channel Pacing Threshold Amplitude: 0.75 V
Lead Channel Pacing Threshold Amplitude: 1.125 V
Lead Channel Pacing Threshold Pulse Width: 0.4 ms
Lead Channel Pacing Threshold Pulse Width: 0.4 ms
Lead Channel Sensing Intrinsic Amplitude: 2.125 mV
Lead Channel Sensing Intrinsic Amplitude: 2.125 mV
Lead Channel Sensing Intrinsic Amplitude: 4.625 mV
Lead Channel Sensing Intrinsic Amplitude: 4.625 mV
Lead Channel Setting Pacing Amplitude: 1.75 V
Lead Channel Setting Pacing Amplitude: 2.25 V
Lead Channel Setting Pacing Pulse Width: 0.4 ms
Lead Channel Setting Sensing Sensitivity: 0.9 mV
Zone Setting Status: 755011
Zone Setting Status: 755011

## 2024-11-03 ENCOUNTER — Ambulatory Visit: Payer: Self-pay | Admitting: Internal Medicine

## 2024-11-03 NOTE — Progress Notes (Signed)
 Remote PPM Transmission

## 2025-01-30 ENCOUNTER — Ambulatory Visit: Payer: Medicare Other

## 2025-01-31 LAB — CUP PACEART REMOTE DEVICE CHECK
Battery Remaining Longevity: 96 mo
Battery Voltage: 3 V
Brady Statistic AP VP Percent: 46.83 %
Brady Statistic AP VS Percent: 0 %
Brady Statistic AS VP Percent: 53.17 %
Brady Statistic AS VS Percent: 0.01 %
Brady Statistic RA Percent Paced: 46.71 %
Brady Statistic RV Percent Paced: 99.99 %
Date Time Interrogation Session: 20260201211256
Implantable Lead Connection Status: 753985
Implantable Lead Connection Status: 753985
Implantable Lead Implant Date: 20120816
Implantable Lead Implant Date: 20120816
Implantable Lead Location: 753859
Implantable Lead Location: 753860
Implantable Lead Model: 5076
Implantable Lead Model: 5076
Implantable Pulse Generator Implant Date: 20230205
Lead Channel Impedance Value: 323 Ohm
Lead Channel Impedance Value: 380 Ohm
Lead Channel Impedance Value: 418 Ohm
Lead Channel Impedance Value: 551 Ohm
Lead Channel Pacing Threshold Amplitude: 0.625 V
Lead Channel Pacing Threshold Amplitude: 1.125 V
Lead Channel Pacing Threshold Pulse Width: 0.4 ms
Lead Channel Pacing Threshold Pulse Width: 0.4 ms
Lead Channel Sensing Intrinsic Amplitude: 2 mV
Lead Channel Sensing Intrinsic Amplitude: 2 mV
Lead Channel Sensing Intrinsic Amplitude: 4.625 mV
Lead Channel Sensing Intrinsic Amplitude: 4.625 mV
Lead Channel Setting Pacing Amplitude: 1.5 V
Lead Channel Setting Pacing Amplitude: 2.25 V
Lead Channel Setting Pacing Pulse Width: 0.4 ms
Lead Channel Setting Sensing Sensitivity: 0.9 mV
Zone Setting Status: 755011
Zone Setting Status: 755011

## 2025-05-01 ENCOUNTER — Ambulatory Visit: Payer: Medicare Other
